# Patient Record
Sex: Female | Born: 1940
Health system: Southern US, Community
[De-identification: ages and names within clinical notes are randomized; demographics above are authoritative.]

## PROBLEM LIST (undated history)

## (undated) DIAGNOSIS — I10 Essential (primary) hypertension: Secondary | ICD-10-CM

## (undated) DIAGNOSIS — D649 Anemia, unspecified: Secondary | ICD-10-CM

## (undated) DIAGNOSIS — N750 Cyst of Bartholin's gland: Secondary | ICD-10-CM

## (undated) DIAGNOSIS — R112 Nausea with vomiting, unspecified: Secondary | ICD-10-CM

## (undated) DIAGNOSIS — E119 Type 2 diabetes mellitus without complications: Secondary | ICD-10-CM

## (undated) DIAGNOSIS — Z9889 Other specified postprocedural states: Secondary | ICD-10-CM

## (undated) DIAGNOSIS — R42 Dizziness and giddiness: Secondary | ICD-10-CM

## (undated) DIAGNOSIS — I8390 Asymptomatic varicose veins of unspecified lower extremity: Secondary | ICD-10-CM

## (undated) DIAGNOSIS — IMO0002 Reserved for concepts with insufficient information to code with codable children: Secondary | ICD-10-CM

## (undated) DIAGNOSIS — M1990 Unspecified osteoarthritis, unspecified site: Secondary | ICD-10-CM

## (undated) HISTORY — DX: Asymptomatic varicose veins of unspecified lower extremity: I83.90

## (undated) HISTORY — DX: Dizziness and giddiness: R42

## (undated) HISTORY — DX: Unspecified osteoarthritis, unspecified site: M19.90

## (undated) HISTORY — DX: Anemia, unspecified: D64.9

## (undated) HISTORY — PX: CHOLECYSTECTOMY: SHX55

## (undated) HISTORY — PX: APPENDECTOMY: SHX54

## (undated) HISTORY — PX: ABDOMINAL HYSTERECTOMY: SHX81

## (undated) HISTORY — DX: Reserved for concepts with insufficient information to code with codable children: IMO0002

## (undated) HISTORY — PX: KNEE ARTHROSCOPY: SUR90

---

## 1976-01-31 HISTORY — PX: VEIN LIGATION AND STRIPPING: SHX2653

## 1998-01-30 ENCOUNTER — Emergency Department (HOSPITAL_COMMUNITY): Admission: EM | Admit: 1998-01-30 | Discharge: 1998-01-30 | Payer: Self-pay | Admitting: Emergency Medicine

## 1998-02-02 ENCOUNTER — Emergency Department (HOSPITAL_COMMUNITY): Admission: EM | Admit: 1998-02-02 | Discharge: 1998-02-02 | Payer: Self-pay | Admitting: Emergency Medicine

## 1998-05-04 ENCOUNTER — Other Ambulatory Visit: Admission: RE | Admit: 1998-05-04 | Discharge: 1998-05-04 | Payer: Self-pay | Admitting: *Deleted

## 1999-03-05 ENCOUNTER — Emergency Department (HOSPITAL_COMMUNITY): Admission: EM | Admit: 1999-03-05 | Discharge: 1999-03-05 | Payer: Self-pay

## 1999-08-25 ENCOUNTER — Other Ambulatory Visit: Admission: RE | Admit: 1999-08-25 | Discharge: 1999-08-25 | Payer: Self-pay | Admitting: *Deleted

## 2000-07-13 ENCOUNTER — Encounter: Payer: Self-pay | Admitting: Surgery

## 2000-07-13 ENCOUNTER — Observation Stay (HOSPITAL_COMMUNITY): Admission: RE | Admit: 2000-07-13 | Discharge: 2000-07-14 | Payer: Self-pay | Admitting: Surgery

## 2000-07-13 ENCOUNTER — Encounter (INDEPENDENT_AMBULATORY_CARE_PROVIDER_SITE_OTHER): Payer: Self-pay | Admitting: Specialist

## 2002-08-26 ENCOUNTER — Other Ambulatory Visit: Admission: RE | Admit: 2002-08-26 | Discharge: 2002-08-26 | Payer: Self-pay | Admitting: Gynecology

## 2003-03-25 ENCOUNTER — Ambulatory Visit (HOSPITAL_COMMUNITY): Admission: RE | Admit: 2003-03-25 | Discharge: 2003-03-25 | Payer: Self-pay | Admitting: Orthopedic Surgery

## 2003-03-25 ENCOUNTER — Ambulatory Visit (HOSPITAL_BASED_OUTPATIENT_CLINIC_OR_DEPARTMENT_OTHER): Admission: RE | Admit: 2003-03-25 | Discharge: 2003-03-25 | Payer: Self-pay | Admitting: Orthopedic Surgery

## 2004-02-11 ENCOUNTER — Other Ambulatory Visit: Admission: RE | Admit: 2004-02-11 | Discharge: 2004-02-11 | Payer: Self-pay | Admitting: Gynecology

## 2005-03-01 ENCOUNTER — Other Ambulatory Visit: Admission: RE | Admit: 2005-03-01 | Discharge: 2005-03-01 | Payer: Self-pay | Admitting: Gynecology

## 2005-03-05 ENCOUNTER — Emergency Department (HOSPITAL_COMMUNITY): Admission: EM | Admit: 2005-03-05 | Discharge: 2005-03-05 | Payer: Self-pay | Admitting: Emergency Medicine

## 2006-03-06 ENCOUNTER — Ambulatory Visit: Payer: Self-pay | Admitting: Vascular Surgery

## 2006-03-19 ENCOUNTER — Ambulatory Visit: Payer: Self-pay | Admitting: Vascular Surgery

## 2006-05-01 ENCOUNTER — Ambulatory Visit: Payer: Self-pay | Admitting: Vascular Surgery

## 2006-05-14 ENCOUNTER — Ambulatory Visit: Payer: Self-pay | Admitting: Vascular Surgery

## 2006-06-14 ENCOUNTER — Other Ambulatory Visit: Admission: RE | Admit: 2006-06-14 | Discharge: 2006-06-14 | Payer: Self-pay | Admitting: Gynecology

## 2006-08-23 ENCOUNTER — Emergency Department (HOSPITAL_COMMUNITY): Admission: EM | Admit: 2006-08-23 | Discharge: 2006-08-23 | Payer: Self-pay | Admitting: Emergency Medicine

## 2008-03-15 ENCOUNTER — Emergency Department (HOSPITAL_COMMUNITY): Admission: EM | Admit: 2008-03-15 | Discharge: 2008-03-15 | Payer: Self-pay | Admitting: Emergency Medicine

## 2008-08-21 ENCOUNTER — Ambulatory Visit (HOSPITAL_BASED_OUTPATIENT_CLINIC_OR_DEPARTMENT_OTHER): Admission: RE | Admit: 2008-08-21 | Discharge: 2008-08-21 | Payer: Self-pay | Admitting: Orthopedic Surgery

## 2010-01-13 ENCOUNTER — Emergency Department (HOSPITAL_COMMUNITY)
Admission: EM | Admit: 2010-01-13 | Discharge: 2010-01-13 | Payer: Self-pay | Source: Home / Self Care | Admitting: Emergency Medicine

## 2010-04-11 LAB — POCT I-STAT, CHEM 8
BUN: 18 mg/dL (ref 6–23)
Calcium, Ion: 1.17 mmol/L (ref 1.12–1.32)
Chloride: 107 mEq/L (ref 96–112)
Creatinine, Ser: 0.8 mg/dL (ref 0.4–1.2)
Glucose, Bld: 87 mg/dL (ref 70–99)
HCT: 43 % (ref 36.0–46.0)
Hemoglobin: 14.6 g/dL (ref 12.0–15.0)
Potassium: 4.1 mEq/L (ref 3.5–5.1)
Sodium: 142 mEq/L (ref 135–145)
TCO2: 27 mmol/L (ref 0–100)

## 2010-05-08 LAB — BASIC METABOLIC PANEL
BUN: 15 mg/dL (ref 6–23)
Calcium: 9 mg/dL (ref 8.4–10.5)
Creatinine, Ser: 0.82 mg/dL (ref 0.4–1.2)
GFR calc non Af Amer: >60 mL/min (ref >60)
Glucose, Bld: 127 mg/dL — ABNORMAL HIGH (ref 70–99)

## 2010-05-08 LAB — POCT HEMOGLOBIN-HEMACUE: Hemoglobin: 12.9 g/dL (ref 12.0–15.0)

## 2010-06-07 ENCOUNTER — Other Ambulatory Visit (HOSPITAL_COMMUNITY): Payer: Self-pay | Admitting: Family Medicine

## 2010-06-07 DIAGNOSIS — Z1231 Encounter for screening mammogram for malignant neoplasm of breast: Secondary | ICD-10-CM

## 2010-06-14 NOTE — Op Note (Signed)
NAMEHARMANI, Erica Buckley            ACCOUNT NO.:  [id number]   MEDICAL RECORD NO.:  [id number]          PATIENT TYPE:  AMB   LOCATION:  DSC                          FACILITY:  [facility]   PHYSICIAN:  Harvie Junior, M.D.   DATE OF BIRTH:  04-11-1940   DATE OF PROCEDURE:  08/21/2008  DATE OF DISCHARGE:                               OPERATIVE REPORT   PREOPERATIVE DIAGNOSES:  1. Trigger finger, right long.  2. Ganglion cyst of the right long finger.   POSTOPERATIVE DIAGNOSES:  1. Trigger finger, right long.  2. Ganglion cyst of the right long finger.   PROCEDURE:  1. A1 pulley release, essentially trigger finger release, right long.  2. Excision of ganglion cyst, right long finger.   SURGEON:  Harvie Junior, MD   ASSISTANT:  Marshia Ly, PA   ANESTHESIA:  General.   BRIEF HISTORY:  Ms. Erica Buckley is a 70 year old female with history of  having had locking and catching of right long finger.  Exam showed that  she had a cyst as well as trigger finger.  We talked about treatment  options.  I felt that the most appropriate course of action was going to  be cyst excision and incision of trigger finger.  She was brought to the  operating room for these procedures.   PROCEDURES:  The patient was brought to the operating room.  After  adequate anesthesia was obtained with general anesthetic, the patient  was placed supine on the operating table.  The right hand was prepped  and draped in the usual sterile fashion.  Following this, a small  incision was made over the A1 pulley to the right long finger.  Subcutaneous tissue was taken down to the level of the trigger finger  which was clearly identified.  The A1 pulley was identified.  There was  a cystic mass coming right through the A1 pulley.  We basically incised  the A1 pulley and we were able to take the mass out as a single entity  and attention was turned back to the flexor tendon and pulled those into  wound to make sure there was  no locking or catching.  The A1 pulley was  completely released.  The wound was irrigated, suctioned dry.  The incision was closed with  interrupted nylon.  Sterile compressive dressing was applied.  The  patient was taken to the recovery room and was noted to be in  satisfactory condition.  Estimated blood loss for procedure was none.      Harvie Junior, M.D.  Electronically Signed     JLG/MEDQ  D:  08/21/2008  T:  08/22/2008  Job:  [id number]

## 2010-06-16 ENCOUNTER — Ambulatory Visit (HOSPITAL_COMMUNITY)
Admission: RE | Admit: 2010-06-16 | Discharge: 2010-06-16 | Disposition: A | Discharge status: Home / Self Care | Payer: Medicare PPO | Source: Ambulatory Visit | Attending: Family Medicine | Admitting: Family Medicine

## 2010-06-16 DIAGNOSIS — Z1231 Encounter for screening mammogram for malignant neoplasm of breast: Secondary | ICD-10-CM | POA: Insufficient documentation

## 2010-06-17 NOTE — Op Note (Signed)
NAMESABIRIN, BARAY                        ACCOUNT NO.:  [id number]   MEDICAL RECORD NO.:  [id number]                   PATIENT TYPE:  AMB   LOCATION:  DSC                                  FACILITY:  [facility]   PHYSICIAN:  Harvie Junior, M.D.                DATE OF BIRTH:  July 04, 1940   DATE OF PROCEDURE:  03/25/2003  DATE OF DISCHARGE:                                 OPERATIVE REPORT   PREOPERATIVE DIAGNOSIS:  Medial meniscal tear.   POSTOPERATIVE DIAGNOSIS:  Lateral meniscal tear, lateral condylar defect and  patellofemoral chondromalacia with medial meniscal tear and medial femoral  chondromalacia.  Multiple osteocartilaginous loose bodies.   OPERATION PERFORMED:  1. Partial medial and partial lateral meniscectomy.  2. Debridement of significant lateral femoral condylar defect.  3. Debridement of significant chondromalacia patella.  4. Removal of multiple osteocartilaginous loose bodies.   SURGEON:  Harvie Junior, M.D.   ASSISTANT:  Marshia Ly, P.A.   ANESTHESIA:  General.   INDICATIONS FOR PROCEDURE:  The patient is a 70 year old female with a long  history of having knee pain.  We had treated her conservatively with anti-  inflammatory medication, injection therapy.  None of this had worked and  because of continued complaints of pain, she was taken to the operating room  for operative knee arthroscopy.   DESCRIPTION OF PROCEDURE:  The patient was taken to the operating room and  after adequate anesthesia was obtained with general anesthetic, the patient  was placed supine on the operating table.  Attention was then turned to the  left knee which was prepped and draped in the usual sterile fashion.  Following this, routine arthroscopic examination of the knee revealed that  there was a small posterior horn tear of the medial meniscus, certainly  nothing dramatic, some grade 2 change on the medial femoral condyle.  These  things were debrided.  Patellofemoral joint  showed some grade 2 and 3 change  in the femoral trochlea.  This was debrided back to a smooth and stable rim.  Attention was turned laterally where the most significant pathology was.  She had a large lateral meniscal tear from the anterior horn around to the  midbody.  She also had a large lateral femoral condylar defect with a large  shouldering lesion.  This was debrided back to a smooth and stable rim but  still was a fairly large defect on the lateral side.  The lateral  meniscectomy was debrided back to a smooth and stable rim.  Multiple  osteocartilaginous loose bodies were debrided from within the knee joint.  After debridement of the lateral compartment, attention was turned back up  into the patellofemoral joint which was again evaluated and debrided from  both the medial and lateral portal.  A final check was made in the knee for  any loose of fragmented piece or articular cartilage.  Seeing  none, the knee  was copiously irrigated and suctioned dry.  The arthroscopic  portals were closed with a bandage.  A sterile compressive dressing was  applied.  The patient was taken to the recovery room where she was noted to  be in satisfactory condition.  The estimated blood loss for this procedure  was none.                                               Harvie Junior, M.D.    Ranae Plumber  D:  03/25/2003  T:  03/25/2003  Job:  [id number]

## 2010-06-17 NOTE — Op Note (Signed)
[place]. [facility]  Patient:    FLOREAN, HOOBLER                     MRN: [id number] Proc. Date: 07/13/00 Adm. Date:  [id number] Attending:  Andre Lefort CC:         Stacie Acres. Cliffton Asters, M.D.  Darcella Cheshire, M.D.   Operative Report  DATE OF BIRTH: 1940/07/02  CCS#: [id number]  PREOPERATIVE DIAGNOSIS: Chronic cholecystitis and cholelithiasis.  POSTOPERATIVE DIAGNOSIS: Significant chronic cholecystitis with adhesions of duodenum to gallbladder and cholelithiasis.  OPERATION/PROCEDURE: Laparoscopic cholecystectomy with intraoperative cholangiogram.  SURGEON: Sandria Bales. Ezzard Standing, M.D.  ANESTHESIA: General endotracheal.  ESTIMATED BLOOD LOSS: Minimal.  INDICATIONS FOR PROCEDURE: Ms. Milas Gain is a 70 year old white female, who comes in with symptomatic cholelithiasis, now for attempted laparoscopic cholecystectomy.  DESCRIPTION OF PROCEDURE: The patient was placed in the supine position and given general endotracheal anesthetic.  With her arms out from her side and PAS stockings in place, she was given 1 g of Ancef prior to the procedure. The abdomen was prepped with Betadine solution and sterilely draped.  An infraumbilical incision was made and sharp dissection carried down to the abdominal cavity.  A 0 degree laparoscope was inserted through a 12 mm Hasson trocar and the Hasson trocar secured with a Vicryl suture.  The 0 degree laparoscope was inserted into the abdomen cavity, visualizing the right and left lobes of the liver, unremarkable.  The stomach was unremarkable.  The remainder of the bowel which could be seen was unremarkable.  The rest was covered with omentum.  Two different trocars were placed, a 10 mm Ethicon trocar in xiphoid location and a 5 mm Ethicon trocar in the right subcostal location, with a 5 mm Ethicon trocar in the right lateral subcostal location.  The gallbladder was grasped and was noted to be enveloped in fat and  adhesions.  These had to be taken down sharply, primarily with hook Bovie coagulation and with just blunt dissection.  About mid way up the gallbladder there was noted to be duodenum stuck to the gallbladder.  Again, these were fairly chronic looking inflammatory changes.  I finally got down to the common bile duct and gallbladder junction and actually as I uncoiled the cystic duct it became fairly long and was probably about 3-4 cm in length.  I opened the triangle of Calot between the cystic duct, the common bile duct, and the liver.  I identified the cystic artery and these two branches were triply clipped and divided.  I then in part because of the difficulty with dissection with all the chronic inflammation and in part because of the length of her symptoms I went on and obtained intraoperative cholangiogram using a cutoff Taut catheter inserted throughout a 14 gauge Jelco into the abdomen and then into the cystic duct.  This was secured with an Endo Clip.  The intraoperative cholangiogram showed a really tiny common bile duct and the contrast refluxed up into the hepatic radicals and down into the common bile duct and duodenum, exited back up the pancreatic duct 3-4 cm. There was no obstruction of fat and no masses, and appeared to be a normal intraoperative cholangiogram.  The Taut catheter was then removed and the cystic duct triply clipped and divided.  A few branches of remaining attachments to the gallbladder were divided with clips and the gallbladder then sharply and bluntly dissected from the gallbladder bed.  The gallbladder was delivered  through the umbilicus in a catch Endobag. Actually I had to open the gallbladder and extract several stones before I could actually force it through the umbilical wound.  The umbilical wound was then closed with 3-0 Vicryl suture.  The abdomen was irrigated with about a liter of saline.  Each trocar site was visualized and there was no  bleeding.  The skin exit site was closed with 5-0 Vicryl, painted with tincture of benzoin, and sterilely dressed.  The patient tolerated the procedure well and was transported to the recovery room in good condition.  I plan to keep her overnight and then probably discharge in the morning. DD:  07/13/00 TD:  07/14/00 Job: [id number]

## 2011-01-17 ENCOUNTER — Ambulatory Visit (INDEPENDENT_AMBULATORY_CARE_PROVIDER_SITE_OTHER): Payer: Medicare PPO

## 2011-01-17 DIAGNOSIS — M79609 Pain in unspecified limb: Secondary | ICD-10-CM

## 2011-01-17 DIAGNOSIS — R609 Edema, unspecified: Secondary | ICD-10-CM

## 2011-01-17 DIAGNOSIS — R059 Cough, unspecified: Secondary | ICD-10-CM

## 2011-01-17 DIAGNOSIS — R05 Cough: Secondary | ICD-10-CM

## 2011-04-23 ENCOUNTER — Other Ambulatory Visit: Payer: Self-pay | Admitting: Family Medicine

## 2011-06-07 ENCOUNTER — Other Ambulatory Visit (HOSPITAL_COMMUNITY): Payer: Self-pay | Admitting: Family Medicine

## 2011-06-07 DIAGNOSIS — Z1231 Encounter for screening mammogram for malignant neoplasm of breast: Secondary | ICD-10-CM

## 2011-07-05 ENCOUNTER — Ambulatory Visit (HOSPITAL_COMMUNITY)
Admission: RE | Admit: 2011-07-05 | Discharge: 2011-07-05 | Disposition: A | Discharge status: Home / Self Care | Payer: Medicare PPO | Source: Ambulatory Visit | Attending: Family Medicine | Admitting: Family Medicine

## 2011-07-05 DIAGNOSIS — Z1231 Encounter for screening mammogram for malignant neoplasm of breast: Secondary | ICD-10-CM

## 2011-10-16 ENCOUNTER — Other Ambulatory Visit: Payer: Self-pay

## 2011-10-16 DIAGNOSIS — I83893 Varicose veins of bilateral lower extremities with other complications: Secondary | ICD-10-CM

## 2011-10-18 ENCOUNTER — Ambulatory Visit (INDEPENDENT_AMBULATORY_CARE_PROVIDER_SITE_OTHER): Payer: Medicare PPO | Admitting: Emergency Medicine

## 2011-10-18 VITALS — Ht 65.5 in | Wt 228.6 lb

## 2011-10-18 DIAGNOSIS — I872 Venous insufficiency (chronic) (peripheral): Secondary | ICD-10-CM

## 2011-10-18 DIAGNOSIS — M79609 Pain in unspecified limb: Secondary | ICD-10-CM

## 2011-10-18 DIAGNOSIS — I8310 Varicose veins of unspecified lower extremity with inflammation: Secondary | ICD-10-CM

## 2011-10-18 DIAGNOSIS — M79606 Pain in leg, unspecified: Secondary | ICD-10-CM

## 2011-10-18 NOTE — Progress Notes (Signed)
   Date:  10/18/2011   Name:  Erica Buckley   DOB:  November 01, 1940   MRN:  [id number] Gender: female Age: 71 y.o.  PCP:  No primary provider on file.    Chief Complaint: Leg Pain and Leg Swelling   History of Present Illness:  Erica Buckley is a 71 y.o. pleasant patient who presents with the following:  Long history of varicose veins.  Has venous stasis dermatitis that has dramatically worsened over the past few months. Seeing a podiatrist who took a biopsy.  She does not know the result.  Has pain in legs and intense itching on the left medial lower leg distally.  There is no problem list on file for this patient.   No past medical history on file.  No past surgical history on file.  History  Substance Use Topics  . Smoking status: Never Smoker   . Smokeless tobacco: Not on file  . Alcohol Use: Not on file    No family history on file.  Allergies  Allergen Reactions  . Codeine Shortness Of Breath    Medication list has been reviewed and updated.  Outpatient Prescriptions Prior to Visit  Medication Sig Dispense Refill  . valACYclovir (VALTREX) 500 MG tablet TAKE 1 TABLET BY MOUTH TWICE DAILY FOR 3 DAYS  30 tablet  0    Review of Systems:  As per HPI, otherwise negative.    Physical Examination: There were no vitals filed for this visit. Filed Vitals:   10/18/11 1908  Height: 5' 5.5" (1.664 m)  Weight: 228 lb 9.6 oz (103.692 kg)   Body mass index is 37.46 kg/(m^2). Ideal Body Weight: Weight in (lb) to have BMI = 25: 152.2    GEN: WDWN, NAD, Non-toxic, Alert & Oriented x 3 HEENT: Atraumatic, Normocephalic.  Ears and Nose: No external deformity. EXTR: No clubbing/cyanosis/edema NEURO: Normal gait.  PSYCH: Normally interactive. Conversant. Not depressed or anxious appearing.  Calm demeanor.  Left leg:  Moderate venous stasis dermatitisi  Assessment and Plan: Venous stasis dermatitis. Unna boot Follow up in one week  Carmelina Dane, MD I  have reviewed and agree with documentation. Robert P. Merla Riches, M.D.

## 2011-10-27 ENCOUNTER — Encounter: Payer: Self-pay | Admitting: Vascular Surgery

## 2011-10-30 ENCOUNTER — Ambulatory Visit (INDEPENDENT_AMBULATORY_CARE_PROVIDER_SITE_OTHER): Payer: Medicare PPO | Admitting: Vascular Surgery

## 2011-10-30 ENCOUNTER — Encounter (INDEPENDENT_AMBULATORY_CARE_PROVIDER_SITE_OTHER): Payer: Medicare PPO | Admitting: *Deleted

## 2011-10-30 ENCOUNTER — Other Ambulatory Visit: Payer: Self-pay | Admitting: *Deleted

## 2011-10-30 ENCOUNTER — Encounter: Payer: Self-pay | Admitting: Vascular Surgery

## 2011-10-30 VITALS — BP 176/93 | HR 66 | Resp 16 | Ht 66.5 in | Wt 220.0 lb

## 2011-10-30 DIAGNOSIS — I83893 Varicose veins of bilateral lower extremities with other complications: Secondary | ICD-10-CM | POA: Insufficient documentation

## 2011-10-30 DIAGNOSIS — M7989 Other specified soft tissue disorders: Secondary | ICD-10-CM

## 2011-10-30 NOTE — Progress Notes (Signed)
Subjective:     Patient ID: Erica Buckley, female   DOB: 04/13/1940, 71 y.o.   MRN: [id number]  HPI this 71 year old female was referred for severe venous insufficiency of both lower extremities. She had a vein stripping in the right leg performed in 1978. She has had some sclerotherapy intermittently on the left leg but no vein stripping. She has developed darkness in the lower third of both legs in the skin and has had some bleeding episodes from thickening medially on the left ankle area. She has no history of stasis ulcers. Both legs cause severe pain with aching throbbing and burning discomfort throughout the day. She does not wear elastic compression stockings. She has no history of DVT or thrombophlebitis. The symptoms are becoming quite severe she states affect her daily living.  Past Medical History  Diagnosis Date  . Anemia   . Ulcer   . Varicose veins     History  Substance Use Topics  . Smoking status: Never Smoker   . Smokeless tobacco: Not on file  . Alcohol Use: 0.6 oz/week    1 Glasses of wine per week    Family History  Problem Relation Age of Onset  . Cancer Father   . Diabetes Father     Allergies  Allergen Reactions  . Codeine Shortness Of Breath    Current outpatient prescriptions:ibuprofen (ADVIL,MOTRIN) 800 MG tablet, Take 800 mg by mouth every 8 (eight) hours as needed., Disp: , Rfl: ;  losartan-hydrochlorothiazide (HYZAAR) 50-12.5 MG per tablet, Take 1 tablet by mouth daily., Disp: , Rfl: ;  metoprolol succinate (TOPROL-XL) 50 MG 24 hr tablet, Take 50 mg by mouth daily. Take with or immediately following a meal., Disp: , Rfl:  valACYclovir (VALTREX) 500 MG tablet, TAKE 1 TABLET BY MOUTH TWICE DAILY FOR 3 DAYS, Disp: 30 tablet, Rfl: 0  BP 176/93  Pulse 66  Resp 16  Ht 5' 6.5" (1.689 m)  Wt 220 lb (99.791 kg)  BMI 34.98 kg/m2  Body mass index is 34.98 kg/(m^2).          Review of Systems denies chest pain, dyspnea on exertion, PND,  orthopnea, hemoptysis, lateralizing weakness, claudication. All systems negative for complete review of systems     Objective:   Physical Exam blood pressure 176-93 heart rate 66 respirations 16 Gen.-alert and oriented x3 in no apparent distress HEENT normal for age Lungs no rhonchi or wheezing Cardiovascular regular rhythm no murmurs carotid pulses 3+ palpable no bruits audible Abdomen soft nontender no palpable masses Musculoskeletal free of  major deformities Skin clear -no rashes Neurologic normal Lower extremities 3+ femoral and dorsalis pedis pulses palpable bilaterally with 1+ edema bilaterally. Right leg with hyperpigmentation lower third medially with no active ulcers. A few bulging varicosities in the medial calf noted on the right side. Left leg has severe thickening hyperpigmentation lower third of the leg with some evidence of previous superficial ulceration near the medial malleolus. Foot well-perfused. Bulging varicosities in the medial distal thigh and medial calf over great saphenous system.  Today I ordered a venous duplex exam of both legs which are reviewed and interpreted. Left leg has gross reflux in the left great saphenous system and the small saphenous system has some mild reflux but not a large caliber vein. There is some deep vein reflux is well. Right leg has gross reflux in a large small saphenous vein. Right saphenous vein has been stripped but a few areas show some mild reflux small-caliber.  Assessment:     Severe bilateral venous insufficiency with severe skin changes-no active ulcer-history of bleeding left ankle-affecting patient's daily living because of pain    Plan:     #1 long-leg elastic compression stockings 20-30 mm gradient #2 elevate legs much possibly day #3 ibuprofen on a daily basis #4 return in 3 months to see Dr. Karlene Buckley who she requested. If no significant improvement I think she should have laser ablation left great saphenous vein to  be followed by laser ablation right small saphenous vein. She would be return in 3 months and possibly have stab phlebectomy of secondary varicosities.

## 2011-11-24 ENCOUNTER — Encounter: Payer: Medicare PPO | Admitting: Vascular Surgery

## 2011-12-12 ENCOUNTER — Other Ambulatory Visit: Payer: Self-pay | Admitting: Physician Assistant

## 2012-01-29 ENCOUNTER — Encounter: Payer: Self-pay | Admitting: Vascular Surgery

## 2012-01-30 ENCOUNTER — Ambulatory Visit: Payer: Medicare PPO | Admitting: Vascular Surgery

## 2012-02-12 ENCOUNTER — Encounter: Payer: Self-pay | Admitting: Vascular Surgery

## 2012-02-13 ENCOUNTER — Encounter: Payer: Self-pay | Admitting: Vascular Surgery

## 2012-02-13 ENCOUNTER — Ambulatory Visit (INDEPENDENT_AMBULATORY_CARE_PROVIDER_SITE_OTHER): Payer: Medicare PPO | Admitting: Vascular Surgery

## 2012-02-13 VITALS — BP 121/43 | HR 81 | Ht 66.5 in | Wt 223.0 lb

## 2012-02-13 DIAGNOSIS — I83893 Varicose veins of bilateral lower extremities with other complications: Secondary | ICD-10-CM

## 2012-02-13 NOTE — Progress Notes (Signed)
Problems with Activities of Daily Living Secondary to Leg Pain  1. Mrs. Erica Buckley states any activities that require prolonged standing (cooking, cleaning,shopping) is very difficult due to leg pain.  2. Mrs. Erica Buckley states she has difficulty sleeping at night because leg pain wakes her up during the night.    3. Mrs. Erica Buckley states that she has stopped walking for exercise due to leg.   Failure of  Conservative Therapy:  1. Worn 20-30 mm Hg thigh high compression hose >3 months with no relief of symptoms.  2. Frequently elevates legs-no relief of symptoms  3. Taken Ibuprofen 600 Mg TID with no relief of symptoms.  The patient presents today for continued followup of her severe bilateral venous hypertension. She has marked progression of venous stasis changes with thickened skin and hemosiderin deposits bilaterally. She continues to have ankle pain and thigh pain despite the use of graduated compression garments. She does elevate her legs when possible as well. She does have a complex past history with prior saphenous vein stripping in her thigh 30 years ago.  I have reviewed her formal venous duplex in her reimage both legs myself with SonoSite. She does have reflux in her great saphenous vein on the left in her small saphenous vein on the right. Marked tributary varicosities arising from both of these. I believe that she has failed conservative treatment.  I have recommended laser ablation of her left great saphenous vein and right small saphenous vein. I explained that she may continue to have difficulty and need a staged as a stab phlebectomy if she continues to have persistent pain. She wished to proceed as soon as possible. She understands this is an outpatient procedure under local anesthetic and the slight risk of deep venous injury.

## 2012-02-14 ENCOUNTER — Other Ambulatory Visit: Payer: Self-pay | Admitting: *Deleted

## 2012-02-14 DIAGNOSIS — I83893 Varicose veins of bilateral lower extremities with other complications: Secondary | ICD-10-CM

## 2012-02-28 ENCOUNTER — Other Ambulatory Visit: Payer: Self-pay | Admitting: *Deleted

## 2012-02-28 ENCOUNTER — Encounter: Payer: Self-pay | Admitting: Vascular Surgery

## 2012-02-28 DIAGNOSIS — I83893 Varicose veins of bilateral lower extremities with other complications: Secondary | ICD-10-CM

## 2012-02-29 ENCOUNTER — Ambulatory Visit (INDEPENDENT_AMBULATORY_CARE_PROVIDER_SITE_OTHER): Payer: Medicare PPO | Admitting: Vascular Surgery

## 2012-02-29 ENCOUNTER — Encounter: Payer: Self-pay | Admitting: Vascular Surgery

## 2012-02-29 VITALS — BP 131/79 | HR 76 | Resp 18 | Ht 67.0 in | Wt 223.0 lb

## 2012-02-29 DIAGNOSIS — L97929 Non-pressure chronic ulcer of unspecified part of left lower leg with unspecified severity: Secondary | ICD-10-CM

## 2012-02-29 DIAGNOSIS — I872 Venous insufficiency (chronic) (peripheral): Secondary | ICD-10-CM

## 2012-02-29 DIAGNOSIS — I83219 Varicose veins of right lower extremity with both ulcer of unspecified site and inflammation: Secondary | ICD-10-CM

## 2012-02-29 DIAGNOSIS — L97919 Non-pressure chronic ulcer of unspecified part of right lower leg with unspecified severity: Secondary | ICD-10-CM

## 2012-02-29 HISTORY — PX: ENDOVENOUS ABLATION SAPHENOUS VEIN W/ LASER: SUR449

## 2012-02-29 NOTE — Progress Notes (Signed)
Laser Ablation Procedure      Date: 02/29/2012    Erica Buckley DOB:1940/10/04  Consent signed: Yes  Surgeon:T.F. Early  Procedure: Laser Ablation: right Small Saphenous Vein  BP 131/79  Pulse 76  Resp 18  Ht 5\' 7"  (1.702 m)  Wt 223 lb (101.152 kg)  BMI 34.93 kg/m2  Start time: 8 :30 AM   End time: 9:20AM  Tumescent Anesthesia: 100 cc 0.9% NaCl with 50 cc Lidocaine HCL with 1% Epi and 15 cc 8.4% NaHCO3  Local Anesthesia: 2 cc Lidocaine HCL and NaHCO3 (ratio 2:1)  Continuous Mode: 15 Watts Total Energy 565 Joules Total Time0:37    Patient tolerated procedure well: Yes    Description of Procedure:  After marking the course of the saphenous vein and the secondary varicosities in the standing position, the patient was placed on the operating table in the prone position, and the right leg was prepped and draped in sterile fashion. Local anesthetic was administered, and under ultrasound guidance the saphenous vein was accessed with a micro needle and guide wire; then the micro puncture sheath was placed. A guide wire was inserted to the saphenopopliteal junction, followed by a 5 french sheath.  The position of the sheath and then the laser fiber below the junction was confirmed using the ultrasound and visualization of the aiming beam.  Tumescent anesthesia was administered along the course of the saphenous vein using ultrasound guidance. Protective laser glasses were placed on the patient, and the laser was fired at at 15 watt continuous mode.  For a total of 565 joules.  A steri strip was applied to the puncture site.     ABD pads and thigh high compression stockings were applied.  Ace wrap bandages were applied  at the top of the saphenopopliteal junction.  Blood loss was less than 15 cc.  The patient ambulated out of the operating room having tolerated the procedure well.  The patient has venous hypertension and marked changes of venous stasis disease in both medial ankles. Our  plan had been left great saphenous vein ablation of a. Reports more inflammation more discomfort on her right calf and therefore we opted for proceeding with right small saphenous ablation today and staged left great saphenous vein. The patient tolerated procedure well without immediate complication will be seen again in one week for repeat ultrasound

## 2012-03-01 ENCOUNTER — Encounter: Payer: Self-pay | Admitting: Vascular Surgery

## 2012-03-05 ENCOUNTER — Telehealth: Payer: Self-pay | Admitting: *Deleted

## 2012-03-05 NOTE — Telephone Encounter (Signed)
03/05/2012  Time: 8:46 AM   Patient Name: Erica Buckley  Patient of: T.F. Early  Procedure:Laser Ablation right small saphenous vein 02-29-2012  Reached patient at home and checked  Her status  Yes    Comments/Actions Taken: Mrs. Milas Gain states she is experiencing mild/moderate pain in her posterior right calf (site of laser ablation) which is lessened with Ibuprofen.  Encouraged her to use ice compress on her right posterior calf as needed for pain in addition to the Ibuprofen.  Encouraged her to keep her right leg elevated.  Reviewed post procedural instructions with her and reminded her of post laser ablation duplex and follow up appointment with Dr. Arbie Cookey on 03-07-2012.      @SIGNATURE @

## 2012-03-06 ENCOUNTER — Encounter: Payer: Self-pay | Admitting: Vascular Surgery

## 2012-03-07 ENCOUNTER — Ambulatory Visit (INDEPENDENT_AMBULATORY_CARE_PROVIDER_SITE_OTHER): Payer: Medicare PPO | Admitting: Vascular Surgery

## 2012-03-07 ENCOUNTER — Encounter: Payer: Self-pay | Admitting: Vascular Surgery

## 2012-03-07 ENCOUNTER — Encounter (INDEPENDENT_AMBULATORY_CARE_PROVIDER_SITE_OTHER): Payer: Medicare PPO | Admitting: *Deleted

## 2012-03-07 VITALS — BP 145/84 | HR 60 | Resp 18 | Ht 67.0 in | Wt 220.0 lb

## 2012-03-07 DIAGNOSIS — I83893 Varicose veins of bilateral lower extremities with other complications: Secondary | ICD-10-CM

## 2012-03-07 DIAGNOSIS — Z48812 Encounter for surgical aftercare following surgery on the circulatory system: Secondary | ICD-10-CM | POA: Insufficient documentation

## 2012-03-07 NOTE — Progress Notes (Deleted)
Patient ID: Erica Buckley, female   DOB: [birth year], 72 y.o.   MRN: [id number]

## 2012-03-07 NOTE — Progress Notes (Signed)
The patient presents today for followup of her right small saphenous vein ablation one week ago. She has the usual amount of soreness in her posterior calf but otherwise no new difficulties. She continues to have discomfort in the area of hemosiderin deposits and venous stasis changes in her ankle bilaterally. He has been compliant with her compression garments.  She underwent a formal duplex which is no evidence of DVT. This does show some persistent flow in her small saphenous vein. I imaged this myself with sinusitis does show partial closure. She had a relatively short segment from the origin of the popliteal vein to the area of the branching and varicosities in her calf. There is some flow in a discussed this with the patient. She does have significant curvature varicosities at may require phlebectomy as well. We will continue observation of this. She does have significant reflux in her left great saphenous vein and is scheduled for a left great saphenous vein ablation on March 13. We will re\re evaluate her right leg this time as well

## 2012-04-10 ENCOUNTER — Encounter: Payer: Self-pay | Admitting: Vascular Surgery

## 2012-04-11 ENCOUNTER — Ambulatory Visit (INDEPENDENT_AMBULATORY_CARE_PROVIDER_SITE_OTHER): Payer: Medicare PPO | Admitting: Vascular Surgery

## 2012-04-11 ENCOUNTER — Encounter: Payer: Self-pay | Admitting: Vascular Surgery

## 2012-04-11 VITALS — BP 138/67 | HR 65 | Resp 18 | Ht 66.5 in | Wt 215.0 lb

## 2012-04-11 DIAGNOSIS — I83893 Varicose veins of bilateral lower extremities with other complications: Secondary | ICD-10-CM

## 2012-04-11 HISTORY — PX: ENDOVENOUS ABLATION SAPHENOUS VEIN W/ LASER: SUR449

## 2012-04-11 NOTE — Progress Notes (Signed)
Laser Ablation Procedure      Date: 04/11/2012    Erica Buckley DOB:1940/12/08  Consent signed: Yes  Surgeon:T.F. Early  Procedure: Laser Ablation: left Greater Saphenous Vein  BP 138/67  Pulse 65  Resp 18  Ht 5' 6.5" (1.689 m)  Wt 215 lb (97.523 kg)  BMI 34.19 kg/m2  Start time: 10:30AM   End time: 11:55AM  Tumescent Anesthesia: 400 cc 0.9% NaCl with 50 cc Lidocaine HCL with 1% Epi and 15 cc 8.4% NaHCO3  Local Anesthesia: 2 cc Lidocaine HCL and NaHCO3 (ratio 2:1)  Continuous Mode: 15 Watts Total Energy #1  880 Joules      #2    1513 Joules Total Time#1  58 seconds   #2 1:40  minutes        Patient tolerated procedure well: Yes    Description of Procedure:  After marking the course of the saphenous vein and the secondary varicosities in the standing position, the patient was placed on the operating table in the supine position, and the left leg was prepped and draped in sterile fashion. Local anesthetic was administered, and under ultrasound guidance the saphenous vein was accessed with a micro needle and guide wire; then the micro puncture sheath was placed. A guide wire was inserted to the saphenofemoral junction, followed by a 5 french sheath.  The position of the sheath and then the laser fiber below the junction was confirmed using the ultrasound and visualization of the aiming beam.  Tumescent anesthesia was administered along the course of the saphenous vein using ultrasound guidance. Protective laser glasses were placed on the patient, and the laser was fired at at 15 watt continuous mode.  For a total of #1 880 Joules  #2  1513 Joules.  A steri strip was applied to the puncture site.  The patient had 2 treatment length of her left saphenous vein. 1 from mid calf to just above the knee and the second from the proximal thigh up to the just below the saphenofemoral junction. She had no immediate complications    ABD pads and thigh high compression stockings were  applied.  Ace wrap bandages were applied at the top of the saphenofemoral junction.  Blood loss was less than 15 cc.  The patient ambulated out of the operating room having tolerated the procedure well.

## 2012-04-12 ENCOUNTER — Telehealth: Payer: Self-pay | Admitting: *Deleted

## 2012-04-12 NOTE — Telephone Encounter (Signed)
04/12/2012  Time: 12:25 PM   Patient Name: Erica Buckley  Patient of: T.F. Early  Procedure:Laser Ablation left greater saphenous vein 04-11-2012  Reached patient at home and checked  Her status  Yes    Comments/Actions Taken: Mrs. Milas Gain states she had "a rough night" last night (04-11-2012).  She states she is still experiencing moderate/severe pain in her left inner thigh---worse in the left inguinal area. Encouraged her to use Arnicare cream to the affected area, use ice compress as needed for pain to affected area, take Ibuprofen 600 mg po three times daily with meals, keep left leg elevated when sitting, and to wear the compression dressing until noon 04-13-2012 and then wear compression hose only daytime for 2 weeks.  Encouraged her to make sure compression dressing (including ace wrap) is pulled up high, completing covering upper left inner thigh where she is experiencing the discomfort.  Reminded her of post laser ablation duplex and follow up appointment with Dr. Arbie Cookey on 04-18-2012.        @SIGNATURE @

## 2012-04-17 ENCOUNTER — Encounter: Payer: Self-pay | Admitting: Vascular Surgery

## 2012-04-18 ENCOUNTER — Telehealth: Payer: Self-pay | Admitting: *Deleted

## 2012-04-18 ENCOUNTER — Ambulatory Visit: Payer: Medicare PPO | Admitting: Vascular Surgery

## 2012-04-18 NOTE — Telephone Encounter (Signed)
Erica Buckley states she is too sore and tender to undergo post ablation Duplex this morning so she is cancelling the appointment.  She denies swelling.  Encouraged her to take Ibuprofen as directed, elevate her left leg, wear compression hose daytime.  Informed Dr. Arbie Cookey of Erica Buckley cancellation of duplex and follow up appointment with him today and he agrees with rescheduling appointments to Tuesday, March 25 (8:30A ultrasound, 9:15A Dr. Arbie Cookey). Erica Buckley notified of rescheduled appointments on March 25 and she states she will be present for those appointments.

## 2012-04-22 ENCOUNTER — Encounter: Payer: Self-pay | Admitting: Vascular Surgery

## 2012-04-23 ENCOUNTER — Encounter (INDEPENDENT_AMBULATORY_CARE_PROVIDER_SITE_OTHER): Payer: Medicare PPO | Admitting: *Deleted

## 2012-04-23 ENCOUNTER — Ambulatory Visit (INDEPENDENT_AMBULATORY_CARE_PROVIDER_SITE_OTHER): Payer: Medicare PPO | Admitting: Vascular Surgery

## 2012-04-23 ENCOUNTER — Encounter: Payer: Self-pay | Admitting: Vascular Surgery

## 2012-04-23 VITALS — BP 130/55 | HR 63 | Resp 18 | Ht 67.0 in | Wt 220.9 lb

## 2012-04-23 DIAGNOSIS — I83229 Varicose veins of left lower extremity with both ulcer of unspecified site and inflammation: Secondary | ICD-10-CM

## 2012-04-23 DIAGNOSIS — Z48812 Encounter for surgical aftercare following surgery on the circulatory system: Secondary | ICD-10-CM

## 2012-04-23 DIAGNOSIS — I83893 Varicose veins of bilateral lower extremities with other complications: Secondary | ICD-10-CM

## 2012-04-23 DIAGNOSIS — I83221 Varicose veins of left lower extremity with both ulcer of thigh and inflammation: Secondary | ICD-10-CM

## 2012-04-23 DIAGNOSIS — I83219 Varicose veins of right lower extremity with both ulcer of unspecified site and inflammation: Secondary | ICD-10-CM

## 2012-04-23 DIAGNOSIS — I83224 Varicose veins of left lower extremity with both ulcer of heel and midfoot and inflammation: Secondary | ICD-10-CM

## 2012-04-23 DIAGNOSIS — L97919 Non-pressure chronic ulcer of unspecified part of right lower leg with unspecified severity: Secondary | ICD-10-CM

## 2012-04-23 NOTE — Progress Notes (Signed)
The patient presents today for followup of her left great saphenous laser ablation for venous hypertension. She is approximately 10 days out. She had more than the usual amount of discomfort throughout her medial left thigh. She reports that this is resolving.  On physical exam she has mild bruising in her upper thigh. She has no bruising in her mid to distal thigh. She does have a palpable thrombosed saphenous vein just below the level of the knee on the medial aspect. She has no skin irritation. She continues to have severe chronic skin changes from her severe long-term venous hypertension.  Noninvasive vascular lab studies today on her left leg revealed complete closure of her great saphenous vein from the proximal calf up to just below the saphenofemoral junction. There is no evidence of DVT.  Impression and plan successful ablation of her left great saphenous vein with no complications. She is resolving the discomfort she had around the time of the procedure. She will continue use of her bilateral compression garments the 2 her bilateral deep venous reflux as well. She will notify us if she has difficulty in the future

## 2012-07-25 ENCOUNTER — Other Ambulatory Visit: Payer: Self-pay | Admitting: *Deleted

## 2012-07-25 DIAGNOSIS — M79609 Pain in unspecified limb: Secondary | ICD-10-CM

## 2012-07-31 ENCOUNTER — Encounter: Payer: Self-pay | Admitting: Vascular Surgery

## 2012-08-01 ENCOUNTER — Encounter (INDEPENDENT_AMBULATORY_CARE_PROVIDER_SITE_OTHER): Payer: Medicare PPO | Admitting: Vascular Surgery

## 2012-08-01 ENCOUNTER — Encounter: Payer: Self-pay | Admitting: Vascular Surgery

## 2012-08-01 ENCOUNTER — Ambulatory Visit (INDEPENDENT_AMBULATORY_CARE_PROVIDER_SITE_OTHER): Payer: Medicare PPO | Admitting: Vascular Surgery

## 2012-08-01 VITALS — BP 184/78 | HR 58 | Resp 16 | Ht 66.0 in | Wt 219.0 lb

## 2012-08-01 DIAGNOSIS — M79609 Pain in unspecified limb: Secondary | ICD-10-CM | POA: Insufficient documentation

## 2012-08-01 DIAGNOSIS — I83893 Varicose veins of bilateral lower extremities with other complications: Secondary | ICD-10-CM

## 2012-08-01 NOTE — Progress Notes (Signed)
The patient presents today for followup of venous hypertension. She is known to me from prior staged laser ablation. This was on her left great saphenous vein and right small saphenous vein. She does have known deep venous reflux and significant changes of venous hypertension bilaterally. She was concerned regarding increasing and some sharp discomfort in her right ankle. She does not have any history of DVT. She does have significant changes of venous hypertension with hemosiderin deposit but has not had venous ulceration  Past Medical History  Diagnosis Date  . Anemia   . Ulcer   . Varicose veins     History  Substance Use Topics  . Smoking status: Never Smoker   . Smokeless tobacco: Never Used  . Alcohol Use: 0.6 oz/week    1 Glasses of wine per week    Family History  Problem Relation Age of Onset  . Cancer Father   . Diabetes Father     Allergies  Allergen Reactions  . Codeine Shortness Of Breath    Current outpatient prescriptions:ibuprofen (ADVIL,MOTRIN) 800 MG tablet, Take 800 mg by mouth every 8 (eight) hours as needed., Disp: , Rfl: ;  losartan-hydrochlorothiazide (HYZAAR) 50-12.5 MG per tablet, Take 1 tablet by mouth daily., Disp: , Rfl: ;  metoprolol succinate (TOPROL-XL) 50 MG 24 hr tablet, Take 50 mg by mouth daily. Take with or immediately following a meal., Disp: , Rfl: ;  valACYclovir (VALTREX) 500 MG tablet, , Disp: , Rfl:   BP 184/78  Pulse 58  Resp 16  Ht 5\' 6"  (1.676 m)  Wt 219 lb (99.338 kg)  BMI 35.36 kg/m2  SpO2 100%  Body mass index is 35.36 kg/(m^2).       Physical exam: Developed female appearing stated age in no distress Respirations nonlabored Neurologically she is grossly intact He does have a circumferential hemosiderin deposits and thickening of skin from the level of her malleolus proximally to distal calf. He does have small scattered superficial varicosities in both lower Shoney's.  Venous duplex today reveals some patency of the  for several centimeters of her right small saphenous vein closure below this. Her right great saphenous vein is competent to the level of the knee. The vein is incompetent below this but relatively small by SonoSite ultrasound.  Impression and plan: A ongoing issues regarding venous hypertension bilaterally. I do not feel that she any significant benefit from ablation of a small area still open below the popliteal vein on the right. She does not have significant reflux in the great saphenous vein on the right and the vein is slightly enlarged below the knee. Feel that her issues mainly related to her venous hypertension bladder superficial femoral vein and popliteal vein. She is concern regarding of DVT. A portion she has never had any issue regarding this and I do not that she is at significantly increased risk. She was reassured that this. She will continue to elevate her legs when possible. She sits with her legs elevated again explained that this does little for improvement that she really needs to lie with her feet aren't in her heart. Also she has not been compliant with her compression garments and again stressing critical importance of this. She will continue the treatments and see [place] again on an as-needed basis

## 2012-10-03 ENCOUNTER — Encounter (INDEPENDENT_AMBULATORY_CARE_PROVIDER_SITE_OTHER): Payer: Self-pay

## 2012-10-03 DIAGNOSIS — I83893 Varicose veins of bilateral lower extremities with other complications: Secondary | ICD-10-CM

## 2012-11-14 ENCOUNTER — Other Ambulatory Visit (HOSPITAL_COMMUNITY): Payer: Self-pay | Admitting: Family Medicine

## 2012-11-14 DIAGNOSIS — Z1231 Encounter for screening mammogram for malignant neoplasm of breast: Secondary | ICD-10-CM

## 2012-11-15 ENCOUNTER — Ambulatory Visit (INDEPENDENT_AMBULATORY_CARE_PROVIDER_SITE_OTHER): Payer: Medicare PPO | Admitting: Family Medicine

## 2012-11-15 VITALS — BP 108/58 | HR 58 | Temp 98.0°F | Resp 16 | Ht 66.0 in | Wt 231.0 lb

## 2012-11-15 DIAGNOSIS — R05 Cough: Secondary | ICD-10-CM

## 2012-11-15 DIAGNOSIS — R059 Cough, unspecified: Secondary | ICD-10-CM

## 2012-11-15 DIAGNOSIS — J069 Acute upper respiratory infection, unspecified: Secondary | ICD-10-CM

## 2012-11-15 MED ORDER — AZITHROMYCIN 250 MG PO TABS: ORAL_TABLET | ORAL | Status: DC

## 2012-11-15 NOTE — Patient Instructions (Addendum)
Saline nasal spray atleast 4 times per day, over the counter mucinex or mucinex DM if needed for cough, drink plenty of fluids.  Keep a record of your blood pressures outside of the office and if remains under 110 on upper number or lightheaded or dizzy - may need to stop Toprol temporarily.  Call your primary provider or return here or ER if this occurs.  If not improving in next 4 to 5 days - can fill antibiotic, but return to the clinic or go to the nearest emergency room if any of your symptoms worsen or new symptoms occur.  Upper Respiratory Infection, Adult An upper respiratory infection (URI) is also sometimes known as the common cold. The upper respiratory tract includes the nose, sinuses, throat, trachea, and bronchi. Bronchi are the airways leading to the lungs. Most people improve within 1 week, but symptoms can last up to 2 weeks. A residual cough may last even longer.  CAUSES Many different viruses can infect the tissues lining the upper respiratory tract. The tissues become irritated and inflamed and often become very moist. Mucus production is also common. A cold is contagious. You can easily spread the virus to others by oral contact. This includes kissing, sharing a glass, coughing, or sneezing. Touching your mouth or nose and then touching a surface, which is then touched by another person, can also spread the virus. SYMPTOMS  Symptoms typically develop 1 to 3 days after you come in contact with a cold virus. Symptoms vary from person to person. They may include:  Runny nose.  Sneezing.  Nasal congestion.  Sinus irritation.  Sore throat.  Loss of voice (laryngitis).  Cough.  Fatigue.  Muscle aches.  Loss of appetite.  Headache.  Low-grade fever. DIAGNOSIS  You might diagnose your own cold based on familiar symptoms, since most people get a cold 2 to 3 times a year. Your caregiver can confirm this based on your exam. Most importantly, your caregiver can check that  your symptoms are not due to another disease such as strep throat, sinusitis, pneumonia, asthma, or epiglottitis. Blood tests, throat tests, and X-rays are not necessary to diagnose a common cold, but they may sometimes be helpful in excluding other more serious diseases. Your caregiver will decide if any further tests are required. RISKS AND COMPLICATIONS  You may be at risk for a more severe case of the common cold if you smoke cigarettes, have chronic heart disease (such as heart failure) or lung disease (such as asthma), or if you have a weakened immune system. The very young and very old are also at risk for more serious infections. Bacterial sinusitis, middle ear infections, and bacterial pneumonia can complicate the common cold. The common cold can worsen asthma and chronic obstructive pulmonary disease (COPD). Sometimes, these complications can require emergency medical care and may be life-threatening. PREVENTION  The best way to protect against getting a cold is to practice good hygiene. Avoid oral or hand contact with people with cold symptoms. Wash your hands often if contact occurs. There is no clear evidence that vitamin C, vitamin E, echinacea, or exercise reduces the chance of developing a cold. However, it is always recommended to get plenty of rest and practice good nutrition. TREATMENT  Treatment is directed at relieving symptoms. There is no cure. Antibiotics are not effective, because the infection is caused by a virus, not by bacteria. Treatment may include:  Increased fluid intake. Sports drinks offer valuable electrolytes, sugars, and fluids.  Breathing  heated mist or steam (vaporizer or shower).  Eating chicken soup or other clear broths, and maintaining good nutrition.  Getting plenty of rest.  Using gargles or lozenges for comfort.  Controlling fevers with ibuprofen or acetaminophen as directed by your caregiver.  Increasing usage of your inhaler if you have  asthma. Zinc gel and zinc lozenges, taken in the first 24 hours of the common cold, can shorten the duration and lessen the severity of symptoms. Pain medicines may help with fever, muscle aches, and throat pain. A variety of non-prescription medicines are available to treat congestion and runny nose. Your caregiver can make recommendations and may suggest nasal or lung inhalers for other symptoms.  HOME CARE INSTRUCTIONS   Only take over-the-counter or prescription medicines for pain, discomfort, or fever as directed by your caregiver.  Use a warm mist humidifier or inhale steam from a shower to increase air moisture. This may keep secretions moist and make it easier to breathe.  Drink enough water and fluids to keep your urine clear or pale yellow.  Rest as needed.  Return to work when your temperature has returned to normal or as your caregiver advises. You may need to stay home longer to avoid infecting others. You can also use a face mask and careful hand washing to prevent spread of the virus. SEEK MEDICAL CARE IF:   After the first few days, you feel you are getting worse rather than better.  You need your caregiver's advice about medicines to control symptoms.  You develop chills, worsening shortness of breath, or brown or red sputum. These may be signs of pneumonia.  You develop yellow or brown nasal discharge or pain in the face, especially when you bend forward. These may be signs of sinusitis.  You develop a fever, swollen neck glands, pain with swallowing, or white areas in the back of your throat. These may be signs of strep throat. SEEK IMMEDIATE MEDICAL CARE IF:   You have a fever.  You develop severe or persistent headache, ear pain, sinus pain, or chest pain.  You develop wheezing, a prolonged cough, cough up blood, or have a change in your usual mucus (if you have chronic lung disease).  You develop sore muscles or a stiff neck. Document Released: 07/12/2000  Document Revised: 04/10/2011 Document Reviewed: 05/20/2010 [facility] Patient Information 2014 [place], [place].

## 2012-11-15 NOTE — Progress Notes (Signed)
Subjective:    Patient ID: Erica Buckley, female    DOB: [birth year], 72 y.o.   MRN: [id number] This chart was scribed for Meredith Staggers, MD by Valera Castle, ED Scribe. This patient was seen in room 5 and the patient's care was started at 1:26 PM.   HPI Erica Buckley is a 72 y.o. female who presents to the [facility] complaining of cough and fatigue, onset 5 days ago. She reports associated aching left ear pain, congestion, rhinorrhea with clear discharge, and decreased appetite. She also reports subjective fever, with chills, but denies having taken a temperature. She reports trying rest, drinking lots of liquids, and eating chicken noodle soup, with no relief. She also reports taking liquid Dayquil as well as Tylenol, and feeling better during the day as result, but having the symptoms reoccur in the evening. She states that her grandson recently had a cold and having been in proximity to him. She denies chest pain or pressure, sore throat, and any other associate symptoms. She denies h/o heart or lung problems. She reports currently being on her blood pressure medicine.   PCP - Laurann Montana  Results for orders placed during the hospital encounter of 01/13/10  POCT I-STAT, CHEM 8      Result Value Range   Sodium 142  135 - 145 mEq/L   Potassium 4.1  3.5 - 5.1 mEq/L   Chloride 107  96 - 112 mEq/L   BUN 18  6 - 23 mg/dL   Creatinine, Ser 0.8  0.4 - 1.2 mg/dL   Glucose, Bld 87  70 - 99 mg/dL   Calcium, Ion 0.27  2.53 - 1.32 mmol/L   TCO2 27  0 - 100 mmol/L   Hemoglobin 14.6  12.0 - 15.0 g/dL   HCT 66.4  40.3 - 47.4 %   Past Medical History  Diagnosis Date  . Anemia   . Ulcer   . Varicose veins    Past Surgical History  Procedure Laterality Date  . Abdominal hysterectomy    . Vein ligation and stripping  1978  . Endovenous ablation saphenous vein w/ laser  02-29-2012    right small saphenous vein  by Gretta Began MD  . Endovenous ablation saphenous vein w/ laser Left  04-11-2012    left greater saphenous by Gretta Began MD   History   Social History  . Marital Status: Married    Spouse Name: N/A    Number of Children: N/A  . Years of Education: N/A   Occupational History  . Not on file.   Social History Main Topics  . Smoking status: Never Smoker   . Smokeless tobacco: Never Used  . Alcohol Use: 0.6 oz/week    1 Glasses of wine per week  . Drug Use: No  . Sexual Activity: Not on file   Other Topics Concern  . Not on file   Social History Narrative  . No narrative on file   Allergies  Allergen Reactions  . Codeine Shortness Of Breath     Review of Systems  Constitutional: Positive for fever (subjective), chills, appetite change and fatigue.  HENT: Positive for congestion, ear pain (left ear ache) and rhinorrhea. Negative for sore throat.   Respiratory: Positive for cough. Negative for chest tightness and shortness of breath.   Cardiovascular: Negative for chest pain.  Skin: Negative for rash.  Neurological: Negative for dizziness and light-headedness.       Objective:   Physical Exam  Nursing note  and vitals reviewed. Constitutional: She is oriented to person, place, and time. She appears well-developed and well-nourished. No distress.  HENT:  Head: Normocephalic and atraumatic.  Right Ear: Hearing, tympanic membrane, external ear and ear canal normal.  Left Ear: Hearing, external ear and ear canal normal.  Nose: Nose normal.  Mouth/Throat: Oropharynx is clear and moist. No oropharyngeal exudate.  Clear fluid behind left TM. No retractions. Miminal tenderness to palpation to left maxillary sinus. Small amount of congestion bilaterally.    Eyes: Conjunctivae and EOM are normal. Pupils are equal, round, and reactive to light.  Cardiovascular: Normal rate, regular rhythm, normal heart sounds and intact distal pulses.   No murmur heard. Pulmonary/Chest: Effort normal and breath sounds normal. No respiratory distress. She has no  wheezes. She has no rhonchi.  Neurological: She is alert and oriented to person, place, and time.  Skin: Skin is warm and dry. No rash noted.  Psychiatric: She has a normal mood and affect. Her behavior is normal.   Filed Vitals:   11/15/12 1224  BP: 108/58  Pulse: 58  Temp: 98 F (36.7 C)  TempSrc: Oral  Resp: 16  Height: 5\' 6"  (1.676 m)  Weight: 231 lb (104.781 kg)  SpO2: 98%   DIAGNOSTIC STUDIES: Oxygen Saturation is 98% on room air, normal by my interpretation.       Assessment & Plan:   Erica Buckley is a 72 y.o. female Acute upper respiratory infections of unspecified site - Plan: azithromycin (ZITHROMAX) 250 MG tablet  Cough - Plan: azithromycin (ZITHROMAX) 250 MG tablet  Suspected viral URI, clear on exam, but if cough not improving in 4-5 days - can start z pak.  Sx care discussed. Also discussed increase in fluids, and check BP's at home - if running below 110 - could hold Toprol, but to call PCP or [place] if this occurs. rc precautions discussed.    Meds ordered this encounter  Medications  . azithromycin (ZITHROMAX) 250 MG tablet    Sig: Take 2 pills by mouth on day 1, then 1 pill by mouth per day on days 2 through 5.    Dispense:  6 each    Refill:  0   Patient Instructions  Saline nasal spray atleast 4 times per day, over the counter mucinex or mucinex DM if needed for cough, drink plenty of fluids.  Keep a record of your blood pressures outside of the office and if remains under 110 on upper number or lightheaded or dizzy - may need to stop Toprol temporarily.  Call your primary provider or return here or ER if this occurs.  If not improving in next 4 to 5 days - can fill antibiotic, but return to the clinic or go to the nearest emergency room if any of your symptoms worsen or new symptoms occur.  Upper Respiratory Infection, Adult An upper respiratory infection (URI) is also sometimes known as the common cold. The upper respiratory tract includes the  nose, sinuses, throat, trachea, and bronchi. Bronchi are the airways leading to the lungs. Most people improve within 1 week, but symptoms can last up to 2 weeks. A residual cough may last even longer.  CAUSES Many different viruses can infect the tissues lining the upper respiratory tract. The tissues become irritated and inflamed and often become very moist. Mucus production is also common. A cold is contagious. You can easily spread the virus to others by oral contact. This includes kissing, sharing a glass, coughing, or sneezing.  Touching your mouth or nose and then touching a surface, which is then touched by another person, can also spread the virus. SYMPTOMS  Symptoms typically develop 1 to 3 days after you come in contact with a cold virus. Symptoms vary from person to person. They may include:  Runny nose.  Sneezing.  Nasal congestion.  Sinus irritation.  Sore throat.  Loss of voice (laryngitis).  Cough.  Fatigue.  Muscle aches.  Loss of appetite.  Headache.  Low-grade fever. DIAGNOSIS  You might diagnose your own cold based on familiar symptoms, since most people get a cold 2 to 3 times a year. Your caregiver can confirm this based on your exam. Most importantly, your caregiver can check that your symptoms are not due to another disease such as strep throat, sinusitis, pneumonia, asthma, or epiglottitis. Blood tests, throat tests, and X-rays are not necessary to diagnose a common cold, but they may sometimes be helpful in excluding other more serious diseases. Your caregiver will decide if any further tests are required. RISKS AND COMPLICATIONS  You may be at risk for a more severe case of the common cold if you smoke cigarettes, have chronic heart disease (such as heart failure) or lung disease (such as asthma), or if you have a weakened immune system. The very young and very old are also at risk for more serious infections. Bacterial sinusitis, middle ear infections, and  bacterial pneumonia can complicate the common cold. The common cold can worsen asthma and chronic obstructive pulmonary disease (COPD). Sometimes, these complications can require emergency medical care and may be life-threatening. PREVENTION  The best way to protect against getting a cold is to practice good hygiene. Avoid oral or hand contact with people with cold symptoms. Wash your hands often if contact occurs. There is no clear evidence that vitamin C, vitamin E, echinacea, or exercise reduces the chance of developing a cold. However, it is always recommended to get plenty of rest and practice good nutrition. TREATMENT  Treatment is directed at relieving symptoms. There is no cure. Antibiotics are not effective, because the infection is caused by a virus, not by bacteria. Treatment may include:  Increased fluid intake. Sports drinks offer valuable electrolytes, sugars, and fluids.  Breathing heated mist or steam (vaporizer or shower).  Eating chicken soup or other clear broths, and maintaining good nutrition.  Getting plenty of rest.  Using gargles or lozenges for comfort.  Controlling fevers with ibuprofen or acetaminophen as directed by your caregiver.  Increasing usage of your inhaler if you have asthma. Zinc gel and zinc lozenges, taken in the first 24 hours of the common cold, can shorten the duration and lessen the severity of symptoms. Pain medicines may help with fever, muscle aches, and throat pain. A variety of non-prescription medicines are available to treat congestion and runny nose. Your caregiver can make recommendations and may suggest nasal or lung inhalers for other symptoms.  HOME CARE INSTRUCTIONS   Only take over-the-counter or prescription medicines for pain, discomfort, or fever as directed by your caregiver.  Use a warm mist humidifier or inhale steam from a shower to increase air moisture. This may keep secretions moist and make it easier to breathe.  Drink  enough water and fluids to keep your urine clear or pale yellow.  Rest as needed.  Return to work when your temperature has returned to normal or as your caregiver advises. You may need to stay home longer to avoid infecting others. You can also use  a face mask and careful hand washing to prevent spread of the virus. SEEK MEDICAL CARE IF:   After the first few days, you feel you are getting worse rather than better.  You need your caregiver's advice about medicines to control symptoms.  You develop chills, worsening shortness of breath, or brown or red sputum. These may be signs of pneumonia.  You develop yellow or brown nasal discharge or pain in the face, especially when you bend forward. These may be signs of sinusitis.  You develop a fever, swollen neck glands, pain with swallowing, or white areas in the back of your throat. These may be signs of strep throat. SEEK IMMEDIATE MEDICAL CARE IF:   You have a fever.  You develop severe or persistent headache, ear pain, sinus pain, or chest pain.  You develop wheezing, a prolonged cough, cough up blood, or have a change in your usual mucus (if you have chronic lung disease).  You develop sore muscles or a stiff neck. Document Released: 07/12/2000 Document Revised: 04/10/2011 Document Reviewed: 05/20/2010 [facility] Patient Information 2014 [place], [place].

## 2012-11-25 ENCOUNTER — Ambulatory Visit (HOSPITAL_COMMUNITY)
Admission: RE | Admit: 2012-11-25 | Discharge: 2012-11-25 | Disposition: A | Discharge status: Home / Self Care | Payer: Medicare PPO | Source: Ambulatory Visit | Attending: Family Medicine | Admitting: Family Medicine

## 2012-11-25 DIAGNOSIS — Z1231 Encounter for screening mammogram for malignant neoplasm of breast: Secondary | ICD-10-CM | POA: Insufficient documentation

## 2013-02-25 ENCOUNTER — Ambulatory Visit (INDEPENDENT_AMBULATORY_CARE_PROVIDER_SITE_OTHER): Payer: Medicare PPO | Admitting: Internal Medicine

## 2013-02-25 VITALS — BP 124/78 | HR 74 | Temp 98.3°F | Resp 16 | Ht 66.5 in | Wt 235.0 lb

## 2013-02-25 DIAGNOSIS — J029 Acute pharyngitis, unspecified: Secondary | ICD-10-CM

## 2013-02-25 LAB — POCT RAPID STREP A (OFFICE): RAPID STREP A SCREEN: NEGATIVE

## 2013-02-25 MED ORDER — AMOXICILLIN 875 MG PO TABS: 875.0000 mg | ORAL_TABLET | Freq: Two times a day (BID) | ORAL | Status: DC

## 2013-02-25 NOTE — Progress Notes (Signed)
Subjective:    Patient ID: Erica Buckley, female    DOB: 16-Oct-1940, 73 y.o.   MRN: [id number]  HPI This chart was scribed for [facility] by Smiley Houseman, Scribe. This patient was seen in room 2 and the patient's care was started at 10:09 AM.  HPI Comments: Erica Buckley is a 73 y.o. female who presents to the [facility] complaining of a constant sore throat with associated generalized myalgia, otalgia, and fatigue that started about 3 days ago.  Pt denies fever and cough, but states that she has been occassionally coughing up mucous.  Temperature is currently 98.59F.  Pt states her throat is worsened when she swallows.  She denies receiving the flu shot.      Past Surgical History  Procedure Laterality Date  . Abdominal hysterectomy    . Vein ligation and stripping  1978  . Endovenous ablation saphenous vein w/ laser  02-29-2012    right small saphenous vein  by Gretta Began MD  . Endovenous ablation saphenous vein w/ laser Left 04-11-2012    left greater saphenous by Gretta Began MD    Family History  Problem Relation Age of Onset  . Cancer Father   . Diabetes Father     History   Social History  . Marital Status: Married    Spouse Name: N/A    Number of Children: N/A  . Years of Education: N/A   Occupational History  . Not on file.   Social History Main Topics  . Smoking status: Never Smoker   . Smokeless tobacco: Never Used  . Alcohol Use: 0.6 oz/week    1 Glasses of wine per week  . Drug Use: No  . Sexual Activity: Not on file   Other Topics Concern  . Not on file   Social History Narrative  . No narrative on file    Allergies  Allergen Reactions  . Codeine Shortness Of Breath    Patient Active Problem List   Diagnosis Date Noted  . Pain in limb- Right leg 08/01/2012  . Aftercare following surgery of the circulatory system, NEC 03/07/2012  . Varicose veins of lower extremities with ulcer and inflammation  02/29/2012  . Unspecified venous (peripheral) insufficiency 02/29/2012  . Varicose veins of lower extremities with other complications 10/30/2011    Review of Systems  Constitutional: Positive for fatigue. Negative for fever and chills.  HENT: Positive for ear pain and sore throat. Negative for congestion, rhinorrhea and sneezing.   Respiratory: Negative for cough and shortness of breath.   Cardiovascular: Negative for chest pain.  Gastrointestinal: Negative for nausea, vomiting, abdominal pain and diarrhea.  Musculoskeletal: Positive for myalgias. Negative for back pain.  Skin: Negative for color change and rash.  Neurological: Negative for syncope and headaches.  All other systems reviewed and are negative.       Objective:   Physical Exam  Nursing note and vitals reviewed. Constitutional: She is oriented to person, place, and time. She appears well-developed and well-nourished. No distress.  HENT:  Head: Normocephalic and atraumatic.  Right Ear: External ear normal.  Left Ear: External ear normal.  Nose: Nose normal. No mucosal edema or rhinorrhea.  Mouth/Throat: Uvula is midline and mucous membranes are normal. Oropharyngeal exudate and posterior oropharyngeal erythema present. No posterior oropharyngeal edema.  Eyes: Conjunctivae and EOM are normal. Right eye exhibits no discharge. Left eye exhibits no discharge.  Neck: Neck supple. No tracheal deviation present.  Cardiovascular: Normal  rate, regular rhythm and normal heart sounds.   No murmur heard. Pulmonary/Chest: Effort normal and breath sounds normal. No respiratory distress. She has no wheezes. She has no rales.  Musculoskeletal: Normal range of motion.  Lymphadenopathy:    She has no cervical adenopathy.  Neurological: She is alert and oriented to person, place, and time.  Skin: Skin is warm and dry.  Psychiatric: She has a normal mood and affect. Her behavior is normal. Judgment and thought content normal.    Triage Vitals: BP 124/78  Pulse 74  Temp(Src) 98.3 F (36.8 C) (Oral)  Resp 16  Ht 5' 6.5" (1.689 m)  Wt 235 lb (106.595 kg)  BMI 37.37 kg/m2  SpO2 97%  Results for orders placed in visit on 02/25/13 (from the past 24 hour(s))  POCT RAPID STREP A (OFFICE)     Status: None   Collection Time    02/25/13 10:22 AM      Result Value Range   Rapid Strep A Screen Negative  Negative      Assessment & Plan:   I have completed the patient encounter in its entirety as documented by the scribe, with editing by me where necessary. Ashvin Adelson P. Merla Richesoolittle, M.D. Acute pharyngitis - Plan: POCT rapid strep A, Throat culture Loney Loh([facility])  call with culture results Meds ordered this encounter  Medications  . amoxicillin (AMOXIL) 875 MG tablet    Sig: Take 1 tablet (875 mg total) by mouth 2 (two) times daily.    Dispense:  20 tablet    Refill:  0

## 2013-02-27 LAB — CULTURE, GROUP A STREP: ORGANISM ID, BACTERIA: NORMAL

## 2013-04-28 ENCOUNTER — Ambulatory Visit: Payer: Medicare PPO | Admitting: Physical Therapy

## 2013-04-29 ENCOUNTER — Ambulatory Visit: Payer: Medicare PPO | Attending: Orthopedic Surgery | Admitting: Rehabilitation

## 2013-04-29 DIAGNOSIS — M25519 Pain in unspecified shoulder: Secondary | ICD-10-CM | POA: Diagnosis not present

## 2013-04-29 DIAGNOSIS — IMO0001 Reserved for inherently not codable concepts without codable children: Secondary | ICD-10-CM | POA: Insufficient documentation

## 2013-05-01 ENCOUNTER — Ambulatory Visit: Payer: Medicare PPO | Admitting: Rehabilitation

## 2013-05-07 ENCOUNTER — Ambulatory Visit: Payer: Medicare PPO | Admitting: Rehabilitation

## 2013-05-12 ENCOUNTER — Ambulatory Visit: Payer: Medicare PPO | Admitting: Rehabilitation

## 2013-05-14 ENCOUNTER — Ambulatory Visit (INDEPENDENT_AMBULATORY_CARE_PROVIDER_SITE_OTHER): Payer: Commercial Managed Care - HMO | Admitting: Family Medicine

## 2013-05-14 VITALS — BP 100/58 | HR 61 | Temp 97.9°F | Resp 16 | Ht 67.0 in | Wt 237.4 lb

## 2013-05-14 DIAGNOSIS — R0982 Postnasal drip: Secondary | ICD-10-CM | POA: Diagnosis not present

## 2013-05-14 DIAGNOSIS — R05 Cough: Secondary | ICD-10-CM

## 2013-05-14 DIAGNOSIS — J301 Allergic rhinitis due to pollen: Secondary | ICD-10-CM | POA: Diagnosis not present

## 2013-05-14 DIAGNOSIS — J019 Acute sinusitis, unspecified: Secondary | ICD-10-CM

## 2013-05-14 DIAGNOSIS — Z9109 Other allergy status, other than to drugs and biological substances: Secondary | ICD-10-CM

## 2013-05-14 DIAGNOSIS — R059 Cough, unspecified: Secondary | ICD-10-CM

## 2013-05-14 MED ORDER — AMOXICILLIN 875 MG PO TABS: 875.0000 mg | ORAL_TABLET | Freq: Two times a day (BID) | ORAL | Status: DC

## 2013-05-14 MED ORDER — PROMETHAZINE-DM 6.25-15 MG/5ML PO SYRP: 2.5000 mL | ORAL_SOLUTION | Freq: Every evening | ORAL | Status: DC | PRN

## 2013-05-14 MED ORDER — BENZONATATE 100 MG PO CAPS: 200.0000 mg | ORAL_CAPSULE | Freq: Two times a day (BID) | ORAL | Status: DC | PRN

## 2013-05-14 NOTE — Progress Notes (Signed)
Chief Complaint:  Chief Complaint  Patient presents with  . Cough    x 1 week    HPI: Erica Buckley is a 73 y.o. female who is here for  1 week history of sore throat, itchy eyes and coughing up yellow-clear sputum and also sinus HA and also pressure around her eyes.   Denies any fevers or chills. Has had ear pain. Has tried otc meds without relief. The cough is the thing that bothers her most at night. She denies allergies. She had a recent h/o sore throat and was given amox, both rapid and throat cx were negative.   Past Medical History  Diagnosis Date  . Anemia   . Ulcer   . Varicose veins    Past Surgical History  Procedure Laterality Date  . Abdominal hysterectomy    . Vein ligation and stripping  1978  . Endovenous ablation saphenous vein w/ laser  02-29-2012    right small saphenous vein  by Erica Beganodd Early MD  . Endovenous ablation saphenous vein w/ laser Left 04-11-2012    left greater saphenous by Erica Beganodd Early MD   History   Social History  . Marital Status: Married    Spouse Name: N/A    Number of Children: N/A  . Years of Education: N/A   Social History Main Topics  . Smoking status: Never Smoker   . Smokeless tobacco: Never Used  . Alcohol Use: 0.6 oz/week    1 Glasses of wine per week  . Drug Use: No  . Sexual Activity: None   Other Topics Concern  . None   Social History Narrative  . None   Family History  Problem Relation Age of Onset  . Cancer Father   . Diabetes Father    Allergies  Allergen Reactions  . Codeine Shortness Of Breath   Prior to Admission medications   Medication Sig Start Date End Date Taking? Authorizing Provider  losartan-hydrochlorothiazide (HYZAAR) 50-12.5 MG per tablet Take 1 tablet by mouth daily.   Yes Historical Provider, MD  metoprolol succinate (TOPROL-XL) 50 MG 24 hr tablet Take 50 mg by mouth daily. Take with or immediately following a meal.   Yes Historical Provider, MD  amoxicillin (AMOXIL) 875 MG  tablet Take 1 tablet (875 mg total) by mouth 2 (two) times daily. 02/25/13   Erica Pearsonobert P Doolittle, MD  azithromycin (ZITHROMAX) 250 MG tablet Take 2 pills by mouth on day 1, then 1 pill by mouth per day on days 2 through 5. 11/15/12   Shade FloodJeffrey R Greene, MD  ibuprofen (ADVIL,MOTRIN) 800 MG tablet Take 800 mg by mouth every 8 (eight) hours as needed.    Historical Provider, MD  valACYclovir (VALTREX) 500 MG tablet  12/12/11   Erica Muttonyan M Dunn, [name]     ROS: The patient denies fevers, chills, night sweats, unintentional weight loss, chest pain, palpitations, wheezing, dyspnea on exertion, nausea, vomiting, abdominal pain, dysuria, hematuria, melena, numbness, weakness, or tingling.   All other systems have been reviewed and were otherwise negative with the exception of those mentioned in the HPI and as above.    PHYSICAL EXAM: Filed Vitals:   05/14/13 1311  BP: 100/58  Pulse: 61  Temp: 97.9 F (36.6 C)  Resp: 16   Filed Vitals:   05/14/13 1311  Height: 5\' 7"  (1.702 m)  Weight: 237 lb 6.4 oz (107.684 kg)   Body mass index is 37.17 kg/(m^2).  General: Alert, no acute distress HEENT:  Normocephalic, atraumatic, oropharynx patent. EOMI, PERRLA, tm nl, + sinus tenderness, no exudates Cardiovascular:  Regular rate and rhythm, no rubs murmurs or gallops.  No Carotid bruits, radial pulse intact. No pedal edema.  Respiratory: Clear to auscultation bilaterally.  No wheezes, rales, or rhonchi.  No cyanosis, no use of accessory musculature GI: No organomegaly, abdomen is soft and non-tender, positive bowel sounds.  No masses. Skin: No rashes. Neurologic: Facial musculature symmetric. Psychiatric: Patient is appropriate throughout our interaction. Lymphatic: No cervical lymphadenopathy Musculoskeletal: Gait intact.   LABS: Results for orders placed in visit on 02/25/13  CULTURE, GROUP A STREP      Result Value Ref Range   Organism ID, Bacteria Normal Upper Respiratory Flora     Organism ID,  Bacteria No Beta Hemolytic Streptococci Isolated    POCT RAPID STREP A (OFFICE)      Result Value Ref Range   Rapid Strep A Screen Negative  Negative     EKG/XRAY:   Primary read [id number]   ASSESSMENT/PLAN: Encounter Diagnoses  Name Primary?  . Pollen allergies Yes  . Post-nasal drip   . Acute sinusitis   . Cough    Try otc meds first: nasacort, tessalon perles, prmethazine DM cough syrup If no improvement then may take amoxacillin F/u prn  Gross sideeffects, risk and benefits, and alternatives of medications d/w patient. Patient is aware that all medications have potential sideeffects and we are unable to predict every sideeffect or drug-drug interaction that may occur.  Erica Antuhao P Randolf Sansoucie, DO 05/15/2013 11:55 AM

## 2013-05-14 NOTE — Patient Instructions (Signed)

## 2013-05-15 ENCOUNTER — Ambulatory Visit: Payer: Medicare PPO | Admitting: Rehabilitation

## 2013-05-19 ENCOUNTER — Ambulatory Visit: Payer: Medicare PPO | Admitting: Rehabilitation

## 2013-05-22 ENCOUNTER — Ambulatory Visit: Payer: Medicare PPO | Admitting: Rehabilitation

## 2013-05-26 ENCOUNTER — Ambulatory Visit: Payer: Medicare PPO | Admitting: Rehabilitation

## 2013-05-29 ENCOUNTER — Ambulatory Visit: Payer: Medicare PPO | Admitting: Rehabilitation

## 2013-08-07 ENCOUNTER — Other Ambulatory Visit: Payer: Self-pay | Admitting: Orthopedic Surgery

## 2013-08-18 ENCOUNTER — Ambulatory Visit (HOSPITAL_COMMUNITY)
Admission: RE | Admit: 2013-08-18 | Discharge: 2013-08-18 | Disposition: A | Discharge status: Home / Self Care | Payer: Medicare PPO | Source: Ambulatory Visit | Attending: Orthopedic Surgery | Admitting: Orthopedic Surgery

## 2013-08-18 ENCOUNTER — Encounter (HOSPITAL_COMMUNITY): Payer: Self-pay

## 2013-08-18 ENCOUNTER — Encounter (HOSPITAL_COMMUNITY)
Admission: RE | Admit: 2013-08-18 | Discharge: 2013-08-18 | Disposition: A | Discharge status: Home / Self Care | Payer: Medicare PPO | Source: Ambulatory Visit | Attending: Orthopedic Surgery | Admitting: Orthopedic Surgery

## 2013-08-18 DIAGNOSIS — Z01818 Encounter for other preprocedural examination: Secondary | ICD-10-CM | POA: Insufficient documentation

## 2013-08-18 HISTORY — DX: Essential (primary) hypertension: I10

## 2013-08-18 HISTORY — DX: Nausea with vomiting, unspecified: Z98.890

## 2013-08-18 HISTORY — DX: Type 2 diabetes mellitus without complications: E11.9

## 2013-08-18 HISTORY — DX: Other specified postprocedural states: R11.2

## 2013-08-18 LAB — CBC WITH DIFFERENTIAL/PLATELET
BASOS PCT: 1 % (ref 0–1)
Basophils Absolute: 0 10*3/uL (ref 0.0–0.1)
EOS ABS: 0.2 10*3/uL (ref 0.0–0.7)
EOS PCT: 3 % (ref 0–5)
HEMATOCRIT: 38.1 % (ref 36.0–46.0)
Hemoglobin: 12.2 g/dL (ref 12.0–15.0)
LYMPHS ABS: 2.7 10*3/uL (ref 0.7–4.0)
Lymphocytes Relative: 52 % — ABNORMAL HIGH (ref 12–46)
MCH: 24.9 pg — AB (ref 26.0–34.0)
MCHC: 32 g/dL (ref 30.0–36.0)
MCV: 77.9 fL — ABNORMAL LOW (ref 78.0–100.0)
MONO ABS: 0.5 10*3/uL (ref 0.1–1.0)
Monocytes Relative: 10 % (ref 3–12)
Neutro Abs: 1.7 10*3/uL (ref 1.7–7.7)
Neutrophils Relative %: 34 % — ABNORMAL LOW (ref 43–77)
Platelets: 205 10*3/uL (ref 150–400)
RBC: 4.89 MIL/uL (ref 3.87–5.11)
RDW: 16.5 % — ABNORMAL HIGH (ref 11.5–15.5)
WBC: 5.2 10*3/uL (ref 4.0–10.5)

## 2013-08-18 LAB — COMPREHENSIVE METABOLIC PANEL
ALBUMIN: 4 g/dL (ref 3.5–5.2)
ALK PHOS: 97 U/L (ref 39–117)
ALT: 21 U/L (ref 0–35)
AST: 19 U/L (ref 0–37)
Anion gap: 12 (ref 5–15)
BUN: 15 mg/dL (ref 6–23)
CALCIUM: 9.5 mg/dL (ref 8.4–10.5)
CO2: 29 mEq/L (ref 19–32)
CREATININE: 0.78 mg/dL (ref 0.50–1.10)
Chloride: 103 mEq/L (ref 96–112)
GFR calc non Af Amer: 81 mL/min — ABNORMAL LOW (ref >90)
Glucose, Bld: 108 mg/dL — ABNORMAL HIGH (ref 70–99)
POTASSIUM: 4.3 meq/L (ref 3.7–5.3)
Sodium: 144 mEq/L (ref 137–147)
Total Bilirubin: 0.3 mg/dL (ref 0.3–1.2)
Total Protein: 7.8 g/dL (ref 6.0–8.3)

## 2013-08-18 LAB — NO BLOOD PRODUCTS

## 2013-08-18 LAB — PROTIME-INR
INR: 1.04 (ref 0.00–1.49)
PROTHROMBIN TIME: 13.6 s (ref 11.6–15.2)

## 2013-08-18 LAB — URINALYSIS, ROUTINE W REFLEX MICROSCOPIC
Bilirubin Urine: NEGATIVE
GLUCOSE, UA: NEGATIVE mg/dL
Hgb urine dipstick: NEGATIVE
KETONES UR: NEGATIVE mg/dL
Leukocytes, UA: NEGATIVE
Nitrite: NEGATIVE
Protein, ur: NEGATIVE mg/dL
Specific Gravity, Urine: 1.028 (ref 1.005–1.030)
UROBILINOGEN UA: 0.2 mg/dL (ref 0.0–1.0)
pH: 5.5 (ref 5.0–8.0)

## 2013-08-18 LAB — SURGICAL PCR SCREEN
MRSA, PCR: NEGATIVE
STAPHYLOCOCCUS AUREUS: NEGATIVE

## 2013-08-18 LAB — APTT: aPTT: 32 seconds (ref 24–37)

## 2013-08-18 NOTE — Progress Notes (Signed)
Primary - dr. Aram Beechamynthia white No cardiologist  ekg in dr. Cliffton AstersWhite office few weeks ago - will request

## 2013-08-18 NOTE — Pre-Procedure Instructions (Signed)
Erica Buckley  08/18/2013   Your procedure is scheduled on:  Monday, July 27th  [facility]45 AM.  Call this number if you have problems the morning of surgery: [phone]   Remember:   Do not eat food or drink liquids after midnight.   Take these medicines the morning of surgery with A SIP OF WATER: toprol  Stop taking aspirin, OTC vitamins/herbal medications, NSAIDS (ibuprofen, advil, motrin) 7 days prior to surgery.   Do not wear jewelry, make-up or nail polish.  Do not wear lotions, powders, or perfumes. You may wear deodorant.  Do not shave 48 hours prior to surgery. Men may shave face and neck.  Do not bring valuables to the hospital.  [facility] is not responsible  for any belongings or valuables.               Contacts, dentures or bridgework may not be worn into surgery.  Leave suitcase in the car. After surgery it may be brought to your room.  For patients admitted to the hospital, discharge time is determined by your  treatment team.              Please read over the following fact sheets that you were given: Pain Booklet, Coughing and Deep Breathing, Blood Transfusion Information, MRSA Information and Surgical Site Infection Prevention [place] - Preparing for Surgery  Before surgery, you can play an important role.  Because skin is not sterile, your skin needs to be as free of germs as possible.  You can reduce the number of germs on you skin by washing with CHG (chlorahexidine gluconate) soap before surgery.  CHG is an antiseptic cleaner which kills germs and bonds with the skin to continue killing germs even after washing.  Please DO NOT use if you have an allergy to CHG or antibacterial soaps.  If your skin becomes reddened/irritated stop using the CHG and inform your nurse when you arrive at Short Stay.  Do not shave (including legs and underarms) for at least 48 hours prior to the first CHG shower.  You may shave your  face.  Please follow these instructions carefully:   1.  Shower with CHG Soap the night before surgery and the morning of Surgery.  2.  If you choose to wash your hair, wash your hair first as usual with your normal shampoo.  3.  After you shampoo, rinse your hair and body thoroughly to remove the shampoo.  4.  Use CHG as you would any other liquid soap.  You can apply CHG directly to the skin and wash gently with scrungie or a clean washcloth.  5.  Apply the CHG Soap to your body ONLY FROM THE NECK DOWN.  Do not use on open wounds or open sores.  Avoid contact with your eyes, ears, mouth and genitals (private parts).  Wash genitals (private parts) with your normal soap.  6.  Wash thoroughly, paying special attention to the area where your surgery will be performed.  7.  Thoroughly rinse your body with warm water from the neck down.  8.  DO NOT shower/wash with your normal soap after using and rinsing off the CHG Soap.  9.  Pat yourself dry with a clean towel.            10.  Wear clean pajamas.            11.  Place clean sheets on your bed  the night of your first shower and do not sleep with pets.  Day of Surgery  Do not apply any lotions/deoderants the morning of surgery.  Please wear clean clothes to the hospital/surgery center.

## 2013-08-24 MED ORDER — CEFAZOLIN SODIUM-DEXTROSE 2-3 GM-% IV SOLR
2.0000 g | INTRAVENOUS | Status: AC
  Administered 2013-08-25: 2 g via INTRAVENOUS
  Filled 2013-08-24: qty 50

## 2013-08-25 ENCOUNTER — Encounter (HOSPITAL_COMMUNITY)
Admission: RE | Disposition: A | Discharge status: Skilled Nursing Facility | Payer: Medicare PPO | Source: Ambulatory Visit | Attending: Orthopedic Surgery

## 2013-08-25 ENCOUNTER — Encounter (HOSPITAL_COMMUNITY): Payer: Medicare PPO | Admitting: Anesthesiology

## 2013-08-25 ENCOUNTER — Inpatient Hospital Stay (HOSPITAL_COMMUNITY)
Admission: RE | Admit: 2013-08-25 | Discharge: 2013-08-28 | DRG: 470 | Disposition: A | Discharge status: Skilled Nursing Facility | Payer: Medicare PPO | Source: Ambulatory Visit | Attending: Orthopedic Surgery | Admitting: Orthopedic Surgery

## 2013-08-25 ENCOUNTER — Inpatient Hospital Stay (HOSPITAL_COMMUNITY): Payer: Medicare PPO | Admitting: Anesthesiology

## 2013-08-25 ENCOUNTER — Encounter (HOSPITAL_COMMUNITY): Payer: Self-pay | Admitting: Anesthesiology

## 2013-08-25 DIAGNOSIS — Z7982 Long term (current) use of aspirin: Secondary | ICD-10-CM

## 2013-08-25 DIAGNOSIS — Z833 Family history of diabetes mellitus: Secondary | ICD-10-CM | POA: Diagnosis not present

## 2013-08-25 DIAGNOSIS — Z96652 Presence of left artificial knee joint: Secondary | ICD-10-CM

## 2013-08-25 DIAGNOSIS — I10 Essential (primary) hypertension: Secondary | ICD-10-CM | POA: Diagnosis present

## 2013-08-25 DIAGNOSIS — Z79899 Other long term (current) drug therapy: Secondary | ICD-10-CM | POA: Diagnosis not present

## 2013-08-25 DIAGNOSIS — Z96659 Presence of unspecified artificial knee joint: Secondary | ICD-10-CM

## 2013-08-25 DIAGNOSIS — E119 Type 2 diabetes mellitus without complications: Secondary | ICD-10-CM | POA: Diagnosis present

## 2013-08-25 DIAGNOSIS — D62 Acute posthemorrhagic anemia: Secondary | ICD-10-CM | POA: Diagnosis not present

## 2013-08-25 DIAGNOSIS — M1710 Unilateral primary osteoarthritis, unspecified knee: Principal | ICD-10-CM | POA: Diagnosis present

## 2013-08-25 DIAGNOSIS — M25569 Pain in unspecified knee: Secondary | ICD-10-CM | POA: Diagnosis present

## 2013-08-25 HISTORY — PX: TOTAL KNEE ARTHROPLASTY: SHX125

## 2013-08-25 LAB — GLUCOSE, CAPILLARY
GLUCOSE-CAPILLARY: 192 mg/dL — AB (ref 70–99)
Glucose-Capillary: 112 mg/dL — ABNORMAL HIGH (ref 70–99)
Glucose-Capillary: 118 mg/dL — ABNORMAL HIGH (ref 70–99)

## 2013-08-25 SURGERY — ARTHROPLASTY, KNEE, TOTAL
Anesthesia: General | Site: Knee | Laterality: Left

## 2013-08-25 MED ORDER — ZOLPIDEM TARTRATE 5 MG PO TABS: 5.0000 mg | ORAL_TABLET | Freq: Every evening | ORAL | Status: DC | PRN

## 2013-08-25 MED ORDER — FENTANYL CITRATE 0.05 MG/ML IJ SOLN
INTRAMUSCULAR | Status: AC
  Filled 2013-08-25: qty 5

## 2013-08-25 MED ORDER — SENNOSIDES-DOCUSATE SODIUM 8.6-50 MG PO TABS: 1.0000 | ORAL_TABLET | Freq: Every evening | ORAL | Status: DC | PRN

## 2013-08-25 MED ORDER — OXYCODONE HCL 5 MG PO TABS
5.0000 mg | ORAL_TABLET | ORAL | Status: DC | PRN
  Administered 2013-08-25 – 2013-08-28 (×12): 10 mg via ORAL
  Filled 2013-08-25 (×13): qty 2

## 2013-08-25 MED ORDER — BUPIVACAINE HCL (PF) 0.25 % IJ SOLN: INTRAMUSCULAR | Status: DC | PRN

## 2013-08-25 MED ORDER — METHOCARBAMOL 500 MG PO TABS
500.0000 mg | ORAL_TABLET | Freq: Four times a day (QID) | ORAL | Status: DC | PRN
  Administered 2013-08-25 – 2013-08-27 (×5): 500 mg via ORAL
  Filled 2013-08-25 (×4): qty 1

## 2013-08-25 MED ORDER — LACTATED RINGERS IV SOLN
INTRAVENOUS | Status: DC
  Administered 2013-08-26 – 2013-08-27 (×2): via INTRAVENOUS

## 2013-08-25 MED ORDER — BUPIVACAINE HCL (PF) 0.25 % IJ SOLN
INTRAMUSCULAR | Status: AC
  Filled 2013-08-25: qty 30

## 2013-08-25 MED ORDER — MIDAZOLAM HCL 2 MG/2ML IJ SOLN
INTRAMUSCULAR | Status: AC
  Administered 2013-08-25: 2 mg
  Filled 2013-08-25: qty 2

## 2013-08-25 MED ORDER — METHOCARBAMOL 500 MG PO TABS
ORAL_TABLET | ORAL | Status: AC
  Administered 2013-08-25: 500 mg via ORAL
  Filled 2013-08-25: qty 1

## 2013-08-25 MED ORDER — BUPIVACAINE HCL (PF) 0.5 % IJ SOLN
INTRAMUSCULAR | Status: DC | PRN
  Administered 2013-08-25: 25 mL via PERINEURAL

## 2013-08-25 MED ORDER — SODIUM CHLORIDE 0.9 % IJ SOLN
INTRAMUSCULAR | Status: DC | PRN
  Administered 2013-08-25: 40 mL via INTRAVENOUS

## 2013-08-25 MED ORDER — DIPHENHYDRAMINE HCL 12.5 MG/5ML PO ELIX: 12.5000 mg | ORAL_SOLUTION | ORAL | Status: DC | PRN

## 2013-08-25 MED ORDER — FLEET ENEMA 7-19 GM/118ML RE ENEM: 1.0000 | ENEMA | Freq: Once | RECTAL | Status: AC | PRN

## 2013-08-25 MED ORDER — CEFAZOLIN SODIUM 1-5 GM-% IV SOLN
1.0000 g | Freq: Four times a day (QID) | INTRAVENOUS | Status: AC
  Administered 2013-08-25 – 2013-08-26 (×2): 1 g via INTRAVENOUS
  Filled 2013-08-25 (×2): qty 50

## 2013-08-25 MED ORDER — DOCUSATE SODIUM 100 MG PO CAPS
100.0000 mg | ORAL_CAPSULE | Freq: Two times a day (BID) | ORAL | Status: DC
  Administered 2013-08-25 – 2013-08-28 (×6): 100 mg via ORAL
  Filled 2013-08-25 (×6): qty 1

## 2013-08-25 MED ORDER — ROCURONIUM BROMIDE 100 MG/10ML IV SOLN
INTRAVENOUS | Status: DC | PRN
  Administered 2013-08-25: 50 mg via INTRAVENOUS

## 2013-08-25 MED ORDER — ONDANSETRON HCL 4 MG/2ML IJ SOLN
INTRAMUSCULAR | Status: AC
  Filled 2013-08-25: qty 2

## 2013-08-25 MED ORDER — ONDANSETRON HCL 4 MG/2ML IJ SOLN
INTRAMUSCULAR | Status: DC | PRN
  Administered 2013-08-25: 4 mg via INTRAVENOUS

## 2013-08-25 MED ORDER — METOCLOPRAMIDE HCL 5 MG/ML IJ SOLN: 10.0000 mg | Freq: Once | INTRAMUSCULAR | Status: DC | PRN

## 2013-08-25 MED ORDER — METOCLOPRAMIDE HCL 5 MG/ML IJ SOLN: 5.0000 mg | Freq: Three times a day (TID) | INTRAMUSCULAR | Status: DC | PRN

## 2013-08-25 MED ORDER — BUPIVACAINE LIPOSOME 1.3 % IJ SUSP
INTRAMUSCULAR | Status: DC | PRN
  Administered 2013-08-25: 20 mL

## 2013-08-25 MED ORDER — LACTATED RINGERS IV SOLN
INTRAVENOUS | Status: DC
  Administered 2013-08-25: 11:00:00 via INTRAVENOUS

## 2013-08-25 MED ORDER — MORPHINE SULFATE 2 MG/ML IJ SOLN: 2.0000 mg | INTRAMUSCULAR | Status: DC | PRN

## 2013-08-25 MED ORDER — LACTATED RINGERS IV SOLN
INTRAVENOUS | Status: DC | PRN
  Administered 2013-08-25 (×2): via INTRAVENOUS

## 2013-08-25 MED ORDER — DEXAMETHASONE SODIUM PHOSPHATE 4 MG/ML IJ SOLN
INTRAMUSCULAR | Status: DC | PRN
  Administered 2013-08-25: 4 mg via INTRAVENOUS

## 2013-08-25 MED ORDER — METOPROLOL SUCCINATE ER 50 MG PO TB24
50.0000 mg | ORAL_TABLET | Freq: Every day | ORAL | Status: DC
  Administered 2013-08-26 – 2013-08-28 (×3): 50 mg via ORAL
  Filled 2013-08-25 (×3): qty 1

## 2013-08-25 MED ORDER — ONDANSETRON HCL 4 MG/2ML IJ SOLN: 4.0000 mg | Freq: Four times a day (QID) | INTRAMUSCULAR | Status: DC | PRN

## 2013-08-25 MED ORDER — GLYCOPYRROLATE 0.2 MG/ML IJ SOLN
INTRAMUSCULAR | Status: DC | PRN
  Administered 2013-08-25: .8 mg via INTRAVENOUS

## 2013-08-25 MED ORDER — ACETAMINOPHEN 500 MG PO TABS
1000.0000 mg | ORAL_TABLET | Freq: Four times a day (QID) | ORAL | Status: AC
  Administered 2013-08-26 (×3): 1000 mg via ORAL
  Filled 2013-08-25 (×4): qty 2

## 2013-08-25 MED ORDER — KETOROLAC TROMETHAMINE 15 MG/ML IJ SOLN
7.5000 mg | Freq: Four times a day (QID) | INTRAMUSCULAR | Status: AC | PRN
  Administered 2013-08-25: 7.5 mg via INTRAVENOUS

## 2013-08-25 MED ORDER — FENTANYL CITRATE 0.05 MG/ML IJ SOLN
25.0000 ug | INTRAMUSCULAR | Status: DC | PRN
  Administered 2013-08-25 (×3): 50 ug via INTRAVENOUS

## 2013-08-25 MED ORDER — FENTANYL CITRATE 0.05 MG/ML IJ SOLN
50.0000 ug | INTRAMUSCULAR | Status: DC | PRN
Start: 2013-08-25 — End: 2013-08-25

## 2013-08-25 MED ORDER — LOSARTAN POTASSIUM-HCTZ 50-12.5 MG PO TABS
1.0000 | ORAL_TABLET | Freq: Every day | ORAL | Status: DC
Start: 2013-08-25 — End: 2013-08-25

## 2013-08-25 MED ORDER — HYDROCHLOROTHIAZIDE 12.5 MG PO CAPS
12.5000 mg | ORAL_CAPSULE | Freq: Every day | ORAL | Status: DC
  Administered 2013-08-27 – 2013-08-28 (×2): 12.5 mg via ORAL
  Filled 2013-08-25 (×4): qty 1

## 2013-08-25 MED ORDER — ACETAMINOPHEN 325 MG PO TABS
650.0000 mg | ORAL_TABLET | Freq: Four times a day (QID) | ORAL | Status: DC | PRN
  Administered 2013-08-27: 650 mg via ORAL
  Filled 2013-08-25 (×2): qty 2

## 2013-08-25 MED ORDER — METOCLOPRAMIDE HCL 10 MG PO TABS: 5.0000 mg | ORAL_TABLET | Freq: Three times a day (TID) | ORAL | Status: DC | PRN

## 2013-08-25 MED ORDER — LOSARTAN POTASSIUM 50 MG PO TABS
50.0000 mg | ORAL_TABLET | Freq: Every day | ORAL | Status: DC
  Administered 2013-08-26 – 2013-08-28 (×3): 50 mg via ORAL
  Filled 2013-08-25 (×4): qty 1

## 2013-08-25 MED ORDER — CHLORHEXIDINE GLUCONATE 4 % EX LIQD
60.0000 mL | Freq: Once | CUTANEOUS | Status: DC
  Filled 2013-08-25: qty 60

## 2013-08-25 MED ORDER — PROPOFOL 10 MG/ML IV BOLUS
INTRAVENOUS | Status: AC
  Filled 2013-08-25: qty 20

## 2013-08-25 MED ORDER — ACETAMINOPHEN 650 MG RE SUPP: 650.0000 mg | Freq: Four times a day (QID) | RECTAL | Status: DC | PRN

## 2013-08-25 MED ORDER — ONDANSETRON HCL 4 MG PO TABS: 4.0000 mg | ORAL_TABLET | Freq: Four times a day (QID) | ORAL | Status: DC | PRN

## 2013-08-25 MED ORDER — FERROUS SULFATE 325 (65 FE) MG PO TABS
325.0000 mg | ORAL_TABLET | Freq: Three times a day (TID) | ORAL | Status: DC
  Administered 2013-08-26: 325 mg via ORAL
  Filled 2013-08-25 (×5): qty 1

## 2013-08-25 MED ORDER — EPHEDRINE SULFATE 50 MG/ML IJ SOLN
INTRAMUSCULAR | Status: DC | PRN
  Administered 2013-08-25: 10 mg via INTRAVENOUS

## 2013-08-25 MED ORDER — FENTANYL CITRATE 0.05 MG/ML IJ SOLN
INTRAMUSCULAR | Status: AC
  Administered 2013-08-25: 50 ug via INTRAVENOUS
  Filled 2013-08-25: qty 2

## 2013-08-25 MED ORDER — ROCURONIUM BROMIDE 50 MG/5ML IV SOLN
INTRAVENOUS | Status: AC
  Filled 2013-08-25: qty 1

## 2013-08-25 MED ORDER — LIDOCAINE HCL (CARDIAC) 20 MG/ML IV SOLN
INTRAVENOUS | Status: DC | PRN
  Administered 2013-08-25: 60 mg via INTRAVENOUS

## 2013-08-25 MED ORDER — KETOROLAC TROMETHAMINE 15 MG/ML IJ SOLN
INTRAMUSCULAR | Status: AC
  Filled 2013-08-25: qty 1

## 2013-08-25 MED ORDER — MIDAZOLAM HCL 2 MG/2ML IJ SOLN: 1.0000 mg | INTRAMUSCULAR | Status: DC | PRN

## 2013-08-25 MED ORDER — PROPOFOL 10 MG/ML IV BOLUS
INTRAVENOUS | Status: DC | PRN
  Administered 2013-08-25: 200 mg via INTRAVENOUS

## 2013-08-25 MED ORDER — BUPIVACAINE LIPOSOME 1.3 % IJ SUSP
20.0000 mL | INTRAMUSCULAR | Status: AC
  Filled 2013-08-25: qty 20

## 2013-08-25 MED ORDER — CEFUROXIME SODIUM 1.5 G IJ SOLR: INTRAMUSCULAR | Status: DC | PRN

## 2013-08-25 MED ORDER — MENTHOL 3 MG MT LOZG: 1.0000 | LOZENGE | OROMUCOSAL | Status: DC | PRN

## 2013-08-25 MED ORDER — PHENOL 1.4 % MT LIQD: 1.0000 | OROMUCOSAL | Status: DC | PRN

## 2013-08-25 MED ORDER — FENTANYL CITRATE 0.05 MG/ML IJ SOLN
INTRAMUSCULAR | Status: DC | PRN
  Administered 2013-08-25 (×3): 50 ug via INTRAVENOUS

## 2013-08-25 MED ORDER — TRANEXAMIC ACID 100 MG/ML IV SOLN
1000.0000 mg | INTRAVENOUS | Status: DC
  Filled 2013-08-25: qty 10

## 2013-08-25 MED ORDER — NEOSTIGMINE METHYLSULFATE 10 MG/10ML IV SOLN
INTRAVENOUS | Status: DC | PRN
  Administered 2013-08-25: 5 mg via INTRAVENOUS

## 2013-08-25 MED ORDER — BISACODYL 10 MG RE SUPP: 10.0000 mg | Freq: Every day | RECTAL | Status: DC | PRN

## 2013-08-25 MED ORDER — ASPIRIN EC 325 MG PO TBEC
325.0000 mg | DELAYED_RELEASE_TABLET | Freq: Every day | ORAL | Status: DC
  Administered 2013-08-26: 325 mg via ORAL
  Filled 2013-08-25 (×2): qty 1

## 2013-08-25 MED ORDER — CEFUROXIME SODIUM 1.5 G IJ SOLR
INTRAMUSCULAR | Status: AC
  Filled 2013-08-25: qty 1.5

## 2013-08-25 MED ORDER — SODIUM CHLORIDE 0.9 % IR SOLN
Status: DC | PRN
  Administered 2013-08-25: 1000 mL

## 2013-08-25 MED ORDER — GLIMEPIRIDE 1 MG PO TABS
1.0000 mg | ORAL_TABLET | Freq: Every day | ORAL | Status: DC
  Administered 2013-08-26 – 2013-08-28 (×3): 1 mg via ORAL
  Filled 2013-08-25 (×4): qty 1

## 2013-08-25 MED ORDER — DEXTROSE 5 % IV SOLN
500.0000 mg | Freq: Four times a day (QID) | INTRAVENOUS | Status: DC | PRN
  Filled 2013-08-25: qty 5

## 2013-08-25 MED ORDER — TRANEXAMIC ACID 100 MG/ML IV SOLN
10000.0000 mg | INTRAVENOUS | Status: DC | PRN
  Administered 2013-08-25: 1000 mg via INTRAVENOUS

## 2013-08-25 MED ORDER — LIDOCAINE HCL (CARDIAC) 20 MG/ML IV SOLN
INTRAVENOUS | Status: AC
Start: 2013-08-25 — End: 2013-08-25
  Filled 2013-08-25: qty 5

## 2013-08-25 MED ORDER — ALUM & MAG HYDROXIDE-SIMETH 200-200-20 MG/5ML PO SUSP: 30.0000 mL | ORAL | Status: DC | PRN

## 2013-08-25 SURGICAL SUPPLY — 63 items
APL SKNCLS STERI-STRIP NONHPOA (GAUZE/BANDAGES/DRESSINGS) ×1
BANDAGE ESMARK 6X9 LF (GAUZE/BANDAGES/DRESSINGS) ×1 IMPLANT
BENZOIN TINCTURE PRP APPL 2/3 (GAUZE/BANDAGES/DRESSINGS) ×2 IMPLANT
BLADE SAGITTAL 25.0X1.19X90 (BLADE) ×2 IMPLANT
BLADE SAW SAG 90X13X1.27 (BLADE) ×2 IMPLANT
BNDG CMPR 9X6 STRL LF SNTH (GAUZE/BANDAGES/DRESSINGS) ×1
BNDG ESMARK 6X9 LF (GAUZE/BANDAGES/DRESSINGS) ×2
BOWL SMART MIX CTS (DISPOSABLE) ×2 IMPLANT
CAPT RP KNEE ×1 IMPLANT
CEMENT HV SMART SET (Cement) ×4 IMPLANT
CLSR STERI-STRIP ANTIMIC 1/2X4 (GAUZE/BANDAGES/DRESSINGS) ×1 IMPLANT
COVER SURGICAL LIGHT HANDLE (MISCELLANEOUS) ×2 IMPLANT
CUFF TOURNIQUET SINGLE 34IN LL (TOURNIQUET CUFF) ×2 IMPLANT
CUFF TOURNIQUET SINGLE 44IN (TOURNIQUET CUFF) IMPLANT
DRAPE EXTREMITY T 121X128X90 (DRAPE) ×2 IMPLANT
DRAPE U-SHAPE 47X51 STRL (DRAPES) ×2 IMPLANT
DRSG PAD ABDOMINAL 8X10 ST (GAUZE/BANDAGES/DRESSINGS) ×2 IMPLANT
DURAPREP 26ML APPLICATOR (WOUND CARE) ×2 IMPLANT
ELECT REM PT RETURN 9FT ADLT (ELECTROSURGICAL) ×2
ELECTRODE REM PT RTRN 9FT ADLT (ELECTROSURGICAL) ×1 IMPLANT
EVACUATOR 1/8 PVC DRAIN (DRAIN) ×2 IMPLANT
FACESHIELD WRAPAROUND (MASK) ×2 IMPLANT
FACESHIELD WRAPAROUND OR TEAM (MASK) ×1 IMPLANT
GAUZE XEROFORM 5X9 LF (GAUZE/BANDAGES/DRESSINGS) ×2 IMPLANT
GLOVE BIOGEL PI IND STRL 8 (GLOVE) ×2 IMPLANT
GLOVE BIOGEL PI INDICATOR 8 (GLOVE) ×2
GLOVE ECLIPSE 7.5 STRL STRAW (GLOVE) ×4 IMPLANT
GOWN STRL REUS W/ TWL LRG LVL3 (GOWN DISPOSABLE) ×1 IMPLANT
GOWN STRL REUS W/ TWL XL LVL3 (GOWN DISPOSABLE) ×2 IMPLANT
GOWN STRL REUS W/TWL LRG LVL3 (GOWN DISPOSABLE) ×2
GOWN STRL REUS W/TWL XL LVL3 (GOWN DISPOSABLE) ×4
HANDPIECE INTERPULSE COAX TIP (DISPOSABLE) ×2
HOOD PEEL AWAY FACE SHEILD DIS (HOOD) ×6 IMPLANT
IMMOBILIZER KNEE 20 (SOFTGOODS) IMPLANT
IMMOBILIZER KNEE 22 UNIV (SOFTGOODS) ×2 IMPLANT
KIT BASIN OR (CUSTOM PROCEDURE TRAY) ×2 IMPLANT
KIT ROOM TURNOVER OR (KITS) ×2 IMPLANT
MANIFOLD NEPTUNE II (INSTRUMENTS) ×2 IMPLANT
MARKER SKIN DUAL TIP RULER LAB (MISCELLANEOUS) ×1 IMPLANT
NDL HYPO 25GX1X1/2 BEV (NEEDLE) IMPLANT
NEEDLE HYPO 25GX1X1/2 BEV (NEEDLE) IMPLANT
NS IRRIG 1000ML POUR BTL (IV SOLUTION) ×2 IMPLANT
PACK TOTAL JOINT (CUSTOM PROCEDURE TRAY) ×2 IMPLANT
PAD ARMBOARD 7.5X6 YLW CONV (MISCELLANEOUS) ×4 IMPLANT
PAD CAST 4YDX4 CTTN HI CHSV (CAST SUPPLIES) ×1 IMPLANT
PADDING CAST COTTON 4X4 STRL (CAST SUPPLIES) ×2
SET HNDPC FAN SPRY TIP SCT (DISPOSABLE) ×1 IMPLANT
SPONGE GAUZE 4X4 12PLY (GAUZE/BANDAGES/DRESSINGS) ×2 IMPLANT
SPONGE GAUZE 4X4 12PLY STER LF (GAUZE/BANDAGES/DRESSINGS) ×1 IMPLANT
STAPLER VISISTAT 35W (STAPLE) IMPLANT
STRIP CLOSURE SKIN 1/2X4 (GAUZE/BANDAGES/DRESSINGS) ×2 IMPLANT
SUCTION FRAZIER TIP 10 FR DISP (SUCTIONS) ×2 IMPLANT
SUT MNCRL AB 3-0 PS2 18 (SUTURE) IMPLANT
SUT VIC AB 0 CTB1 27 (SUTURE) ×4 IMPLANT
SUT VIC AB 1 CT1 27 (SUTURE) ×4
SUT VIC AB 1 CT1 27XBRD ANBCTR (SUTURE) ×2 IMPLANT
SUT VIC AB 2-0 CTB1 (SUTURE) ×4 IMPLANT
SYR 50ML LL SCALE MARK (SYRINGE) ×2 IMPLANT
SYR CONTROL 10ML LL (SYRINGE) IMPLANT
TOWEL OR 17X24 6PK STRL BLUE (TOWEL DISPOSABLE) ×2 IMPLANT
TOWEL OR 17X26 10 PK STRL BLUE (TOWEL DISPOSABLE) ×2 IMPLANT
TRAY FOLEY CATH 16FRSI W/METER (SET/KITS/TRAYS/PACK) ×2 IMPLANT
WATER STERILE IRR 1000ML POUR (IV SOLUTION) ×4 IMPLANT

## 2013-08-25 NOTE — Brief Op Note (Signed)
08/25/2013  2:33 PM  PATIENT:  Erica Buckley  73 y.o. female  PRE-OPERATIVE DIAGNOSIS:  OSTEOARTHRITIS  POST-OPERATIVE DIAGNOSIS:  OSTEOARTHRITIS  PROCEDURE:  Procedure(s): LEFT TOTAL KNEE ARTHROPLASTY (Left)  SURGEON:  Surgeon(s) and Role:    * Harvie JuniorJohn L Vivek Grealish, MD - Primary  PHYSICIAN ASSISTANT:   ASSISTANTS: justin rnfa  ANESTHESIA:   general  EBL:  Total I/O In: 1000 [I.V.:1000] Out: 225 [Urine:75; Blood:150]  BLOOD ADMINISTERED:none  DRAINS: (1 med) Hemovact drain(s) in the l knee with  Suction Open   LOCAL MEDICATIONS USED:  OTHER experel  SPECIMEN:  No Specimen  DISPOSITION OF SPECIMEN:  N/A  COUNTS:  YES  TOURNIQUET:   Total Tourniquet Time Documented: Thigh (Left) - 59 minutes Total: Thigh (Left) - 59 minutes   DICTATION: .Other Dictation: Dictation Number [id number]  PLAN OF CARE: Admit to inpatient   PATIENT DISPOSITION:  PACU - hemodynamically stable.   Delay start of Pharmacological VTE agent (>24hrs) due to surgical blood loss or risk of bleeding: no

## 2013-08-25 NOTE — Evaluation (Signed)
Physical Therapy Evaluation Patient Details Name: Erica CrankerCatherine L Buckley MRN: [id number] DOB: 1940-08-13 Today's Date: 08/25/2013   History of Present Illness  Patient is a 73 yo female admitted 08/25/13 now s/p Lt TKA.  PMH:  anemia, HTN, DM, headaches.  Clinical Impression  Patient presents with problems listed below.  Will benefit from acute PT to maximize independence prior to discharge.    Follow Up Recommendations SNF;Supervision/Assistance - 24 hour    Equipment Recommendations  Rolling walker with 5" wheels;3in1 (PT)    Recommendations for Other Services       Precautions / Restrictions Precautions Precautions: Knee Precaution Booklet Issued: Yes (comment) Precaution Comments: Reviewed precautions with patient. Required Braces or Orthoses:  (No orders, but KI in room.  Asked for clarification.) Restrictions Weight Bearing Restrictions:  (No orders.  Asked for clarification by MD.)      Mobility  Bed Mobility Overal bed mobility: Needs Assistance Bed Mobility: Supine to Sit;Sit to Supine     Supine to sit: Min assist Sit to supine: Min assist   General bed mobility comments: Verbal cues for technique.  Assist to move LLE off of bed.  Once upright, patient able to maintain sitting balance with min guard assist.  Patient sat EOB x 10 minutes.  Transfers                 General transfer comment: Not attempted - no weight bearing status ordered.  Asked MD for clarification in sticky note.  Ambulation/Gait                Stairs            Wheelchair Mobility    Modified Rankin (Stroke Patients Only)       Balance                                             Pertinent Vitals/Pain Pain 5/10 in Lt knee with mobility.    Home Living Family/patient expects to be discharged to:: Skilled nursing facility [facility] Living Arrangements: Other relatives             Home Equipment: None      Prior Function Level  of Independence: Independent               Hand Dominance        Extremity/Trunk Assessment   Upper Extremity Assessment: Overall WFL for tasks assessed           Lower Extremity Assessment: LLE deficits/detail   LLE Deficits / Details: Decreased strength/ROM post-op  Cervical / Trunk Assessment: Normal  Communication   Communication: No difficulties  Cognition Arousal/Alertness: Awake/alert Behavior During Therapy: WFL for tasks assessed/performed Overall Cognitive Status: Within Functional Limits for tasks assessed                      General Comments      Exercises Total Joint Exercises Ankle Circles/Pumps: AROM;Both;10 reps;Seated      Assessment/Plan    PT Assessment Patient needs continued PT services  PT Diagnosis Difficulty walking;Acute pain   PT Problem List Decreased strength;Decreased range of motion;Decreased activity tolerance;Decreased balance;Decreased mobility;Decreased knowledge of use of DME;Decreased knowledge of precautions;Pain  PT Treatment Interventions DME instruction;Gait training;Functional mobility training;Therapeutic activities;Therapeutic exercise;Patient/family education   PT Goals (Current goals can be found in the Care Plan section) Acute Rehab  PT Goals Patient Stated Goal: To get stronger PT Goal Formulation: With patient Time For Goal Achievement: 09/01/13 Potential to Achieve Goals: Good    Frequency 7X/week   Barriers to discharge        Co-evaluation               End of Session Equipment Utilized During Treatment: Oxygen Activity Tolerance: Patient tolerated treatment well Patient left: in bed;with call bell/phone within reach;with family/visitor present Nurse Communication: Mobility status (Need orders for weightbearing status and knee immobilizer)         Time: 1929-1955 PT Time Calculation (min): 26 min   Charges:   PT Evaluation $Initial PT Evaluation Tier I: 1 Procedure PT  Treatments $Therapeutic Activity: 8-22 mins   PT G Codes:          Vena Austria 08/25/2013, 8:20 PM Durenda Hurt. Renaldo Fiddler, [facility] Acute Rehab Services Pager [phone]

## 2013-08-25 NOTE — Progress Notes (Signed)
Orthopedic Tech Progress Note Patient Details:  Erica Buckley 1940-04-15 [id number]  CPM Left Knee CPM Left Knee: On Left Knee Flexion (Degrees): 60 Left Knee Extension (Degrees): 0 Additional Comments: trapeze bar patient helper   [name], [name] 08/25/2013, 3:58 PM

## 2013-08-25 NOTE — H&P (Signed)
TOTAL KNEE ADMISSION H&P  Patient is being admitted for left total knee arthroplasty.  Subjective:  Chief Complaint:left knee pain.  HPI: Erica Buckley, 73 y.o. female, has a history of pain and functional disability in the left knee due to arthritis and has failed non-surgical conservative treatments for greater than 12 weeks to includeNSAID's and/or analgesics, corticosteriod injections, viscosupplementation injections, flexibility and strengthening excercises, use of assistive devices, weight reduction as appropriate and activity modification.  Onset of symptoms was gradual, starting 4 years ago with gradually worsening course since that time. The patient noted no past surgery on the left knee(s).  Patient currently rates pain in the left knee(s) at 8 out of 10 with activity. Patient has night pain, worsening of pain with activity and weight bearing, pain that interferes with activities of daily living, pain with passive range of motion, crepitus and joint swelling.  Patient has evidence of  by imaging studies. This patient has had failure of all reasonable conservative care. There is no active infection.  Patient Active Problem List   Diagnosis Date Noted  . Pain in limb- Right leg 08/01/2012  . Aftercare following surgery of the circulatory system, [place] 03/07/2012  . Varicose veins of lower extremities with ulcer and inflammation 02/29/2012  . Unspecified venous (peripheral) insufficiency 02/29/2012  . Varicose veins of lower extremities with other complications 01/74/9449   Past Medical History  Diagnosis Date  . Anemia   . Ulcer   . Varicose veins   . PONV (postoperative nausea and vomiting)   . Hypertension   . Diabetes mellitus without complication     fasting 110s  . QPRFFMBW(466.5)     Past Surgical History  Procedure Laterality Date  . Abdominal hysterectomy    . Vein ligation and stripping  1978  . Endovenous ablation saphenous vein w/ laser  02-29-2012    right  small saphenous vein  by Curt Jews MD  . Endovenous ablation saphenous vein w/ laser Left 04-11-2012    left greater saphenous by Curt Jews MD  . Knee arthroscopy Left     Prescriptions prior to admission  Medication Sig Dispense Refill  . diclofenac sodium (VOLTAREN) 1 % GEL Apply 2-4 g topically 3 (three) times daily as needed (for pain).      Marland Kitchen glimepiride (AMARYL) 1 MG tablet Take 1 mg by mouth daily with breakfast.      . ibuprofen (ADVIL,MOTRIN) 800 MG tablet Take 800 mg by mouth 2 (two) times daily as needed for mild pain.       Marland Kitchen losartan-hydrochlorothiazide (HYZAAR) 50-12.5 MG per tablet Take 1 tablet by mouth daily.      . metoprolol succinate (TOPROL-XL) 50 MG 24 hr tablet Take 50 mg by mouth daily. Take with or immediately following a meal.       Allergies  Allergen Reactions  . Codeine Shortness Of Breath    History  Substance Use Topics  . Smoking status: Never Smoker   . Smokeless tobacco: Never Used  . Alcohol Use: 0.6 oz/week    1 Glasses of wine per week    Family History  Problem Relation Age of Onset  . Cancer Father   . Diabetes Father      ROS ROS: I have reviewed the patient's review of systems thoroughly and there are no positive responses as relates to the HPI. Objective:  Physical Exam  Vital signs in last 24 hours: Temp:  [98.4 F (36.9 C)] 98.4 F (36.9 C) (07/27 1044)  Pulse Rate:  [54-68] 54 (07/27 1205) Resp:  [8-20] 17 (07/27 1205) BP: (105-168)/(53-69) 112/53 mmHg (07/27 1205) SpO2:  [97 %-100 %] 100 % (07/27 1205) Weight:  [233 lb (105.688 kg)] 233 lb (105.688 kg) (07/27 1044) Well-developed well-nourished patient in no acute distress. Alert and oriented x3 HEENT:within normal limits Cardiac: Regular rate and rhythm Pulmonary: Lungs clear to auscultation Abdomen: Soft and nontender.  Normal active bowel sounds  Musculoskeletal: (Left knee: Painful range of motion.  Range of motion 5-95.  Mild valgus malalignment.  No  instability. Labs: Recent Results (from the past 2160 hour(s))  NO BLOOD PRODUCTS     Status: None   Collection Time    08/18/13 10:15 AM      Result Value Ref Range   Transfuse no blood products       Value: TRANSFUSE NO BLOOD PRODUCTS, VERIFIED BY STACY CARTER,RN  SURGICAL PCR SCREEN     Status: None   Collection Time    08/18/13 10:21 AM      Result Value Ref Range   MRSA, PCR NEGATIVE  NEGATIVE   Staphylococcus aureus NEGATIVE  NEGATIVE   Comment:            The Xpert SA Assay (FDA     approved for NASAL specimens     in patients over 34 years of age),     is one component of     a comprehensive surveillance     program.  Test performance has     been validated by [employer] for patients greater     than or equal to 70 year old.     It is not intended     to diagnose infection nor to     guide or monitor treatment.  URINALYSIS, ROUTINE W REFLEX MICROSCOPIC     Status: None   Collection Time    08/18/13 10:21 AM      Result Value Ref Range   Color, Urine YELLOW  YELLOW   APPearance CLEAR  CLEAR   Specific Gravity, Urine 1.028  1.005 - 1.030   pH 5.5  5.0 - 8.0   Glucose, UA NEGATIVE  NEGATIVE mg/dL   Hgb urine dipstick NEGATIVE  NEGATIVE   Bilirubin Urine NEGATIVE  NEGATIVE   Ketones, ur NEGATIVE  NEGATIVE mg/dL   Protein, ur NEGATIVE  NEGATIVE mg/dL   Urobilinogen, UA 0.2  0.0 - 1.0 mg/dL   Nitrite NEGATIVE  NEGATIVE   Leukocytes, UA NEGATIVE  NEGATIVE   Comment: MICROSCOPIC NOT DONE ON URINES WITH NEGATIVE PROTEIN, BLOOD, LEUKOCYTES, NITRITE, OR GLUCOSE <1000 mg/dL.  APTT     Status: None   Collection Time    08/18/13 10:23 AM      Result Value Ref Range   aPTT 32  24 - 37 seconds  CBC WITH DIFFERENTIAL     Status: Abnormal   Collection Time    08/18/13 10:23 AM      Result Value Ref Range   WBC 5.2  4.0 - 10.5 K/uL   RBC 4.89  3.87 - 5.11 MIL/uL   Hemoglobin 12.2  12.0 - 15.0 g/dL   HCT 38.1  36.0 - 46.0 %   MCV 77.9 (*) 78.0 - 100.0 fL   MCH 24.9  (*) 26.0 - 34.0 pg   MCHC 32.0  30.0 - 36.0 g/dL   RDW 16.5 (*) 11.5 - 15.5 %   Platelets 205  150 - 400 K/uL  Neutrophils Relative % 34 (*) 43 - 77 %   Neutro Abs 1.7  1.7 - 7.7 K/uL   Lymphocytes Relative 52 (*) 12 - 46 %   Lymphs Abs 2.7  0.7 - 4.0 K/uL   Monocytes Relative 10  3 - 12 %   Monocytes Absolute 0.5  0.1 - 1.0 K/uL   Eosinophils Relative 3  0 - 5 %   Eosinophils Absolute 0.2  0.0 - 0.7 K/uL   Basophils Relative 1  0 - 1 %   Basophils Absolute 0.0  0.0 - 0.1 K/uL  COMPREHENSIVE METABOLIC PANEL     Status: Abnormal   Collection Time    08/18/13 10:23 AM      Result Value Ref Range   Sodium 144  137 - 147 mEq/L   Potassium 4.3  3.7 - 5.3 mEq/L   Chloride 103  96 - 112 mEq/L   CO2 29  19 - 32 mEq/L   Glucose, Bld 108 (*) 70 - 99 mg/dL   BUN 15  6 - 23 mg/dL   Creatinine, Ser 0.78  0.50 - 1.10 mg/dL   Calcium 9.5  8.4 - 10.5 mg/dL   Total Protein 7.8  6.0 - 8.3 g/dL   Albumin 4.0  3.5 - 5.2 g/dL   AST 19  0 - 37 U/L   ALT 21  0 - 35 U/L   Alkaline Phosphatase 97  39 - 117 U/L   Total Bilirubin 0.3  0.3 - 1.2 mg/dL   GFR calc non Af Amer 81 (*) >90 mL/min   GFR calc Af Amer >90  >90 mL/min   Comment: (NOTE)     The eGFR has been calculated using the CKD EPI equation.     This calculation has not been validated in all clinical situations.     eGFR's persistently <90 mL/min signify possible Chronic Kidney     Disease.   Anion gap 12  5 - 15  PROTIME-INR     Status: None   Collection Time    08/18/13 10:23 AM      Result Value Ref Range   Prothrombin Time 13.6  11.6 - 15.2 seconds   INR 1.04  0.00 - 1.49  GLUCOSE, CAPILLARY     Status: Abnormal   Collection Time    08/25/13 10:46 AM      Result Value Ref Range   Glucose-Capillary 112 (*) 70 - 99 mg/dL      Estimated body mass index is 37.05 kg/(m^2) as calculated from the following:   Height as of 08/18/13: 5' 6.5" (1.689 m).   Weight as of this encounter: 233 lb (105.688 kg).   Imaging Review Plain  radiographs demonstrate severe degenerative joint disease of the left knee(s). The overall alignment ismild valgus. The bone quality appears to be good for age and reported activity level.  Assessment/Plan:  End stage arthritis, left knee   The patient history, physical examination, clinical judgment of the provider and imaging studies are consistent with end stage degenerative joint disease of the left knee(s) and total knee arthroplasty is deemed medically necessary. The treatment options including medical management, injection therapy arthroscopy and arthroplasty were discussed at length. The risks and benefits of total knee arthroplasty were presented and reviewed. The risks due to aseptic loosening, infection, stiffness, patella tracking problems, thromboembolic complications and other imponderables were discussed. The patient acknowledged the explanation, agreed to proceed with the plan and consent was signed. Patient is being admitted for  inpatient treatment for surgery, pain control, PT, OT, prophylactic antibiotics, VTE prophylaxis, progressive ambulation and ADL's and discharge planning. The patient is planning to be discharged home with home health services

## 2013-08-25 NOTE — Anesthesia Preprocedure Evaluation (Signed)
Anesthesia Evaluation  Patient identified by MRN, date of birth, ID band Patient awake    Reviewed: Allergy & Precautions, H&P , NPO status , Patient's Chart, lab work & pertinent test results, reviewed documented beta blocker date and time   History of Anesthesia Complications (+) PONV and history of anesthetic complications  Airway Mallampati: II TM Distance: >3 FB Neck ROM: full    Dental   Pulmonary neg pulmonary ROS,  breath sounds clear to auscultation        Cardiovascular hypertension, On Medications and On Home Beta Blockers + Peripheral Vascular Disease Rhythm:regular     Neuro/Psych  Headaches, negative psych ROS   GI/Hepatic negative GI ROS, Neg liver ROS,   Endo/Other  diabetes, Oral Hypoglycemic AgentsMorbid obesity  Renal/GU negative Renal ROS  negative genitourinary   Musculoskeletal   Abdominal   Peds  Hematology negative hematology ROS (+)   Anesthesia Other Findings See surgeon's H&P   Reproductive/Obstetrics negative OB ROS                           Anesthesia Physical Anesthesia Plan  ASA: III  Anesthesia Plan: General   Post-op Pain Management:    Induction: Intravenous  Airway Management Planned: Oral ETT  Additional Equipment:   Intra-op Plan:   Post-operative Plan: Extubation in OR  Informed Consent: I have reviewed the patients History and Physical, chart, labs and discussed the procedure including the risks, benefits and alternatives for the proposed anesthesia with the patient or authorized representative who has indicated his/her understanding and acceptance.   Dental Advisory Given  Plan Discussed with: CRNA and Surgeon  Anesthesia Plan Comments:         Anesthesia Quick Evaluation

## 2013-08-25 NOTE — Anesthesia Procedure Notes (Signed)
Anesthesia Regional Block:  Adductor canal block  Pre-Anesthetic Checklist: ,, timeout performed, Correct Patient, Correct Site, Correct Laterality, Correct Procedure, Correct Position, site marked, Risks and benefits discussed,  Surgical consent,  Pre-op evaluation,  At surgeon's request and post-op pain management  Laterality: Left  Prep: chloraprep       Needles:   Needle Type: Other     Needle Length: 9cm 9 cm Needle Gauge: 21 and 21 G    Additional Needles:  Procedures: ultrasound guided (picture in chart) Adductor canal block Narrative:  Start time: 08/25/2013 11:23 AM End time: 08/25/2013 11:30 AM Injection made incrementally with aspirations every 5 mL.  Performed by: Personally  Anesthesiologist: C. Ulice Follett MD  Additional Notes: Ultrasound guidance used to: id relevant anatomy, confirm needle position, local anesthetic spread, avoidance of vascular puncture. Picture saved. No complications. Block performed personally by Janetta Horaharles E. Gelene MinkFrederick, MD

## 2013-08-25 NOTE — Transfer of Care (Signed)
Immediate Anesthesia Transfer of Care Note  Patient: Erica Buckley  Procedure(s) Performed: Procedure(s): LEFT TOTAL KNEE ARTHROPLASTY (Left)  Patient Location: PACU  Anesthesia Type:General  Level of Consciousness: awake, alert  and oriented  Airway & Oxygen Therapy: Patient Spontanous Breathing  Post-op Assessment: Report given to PACU RN  Post vital signs: Reviewed and stable  Complications: No apparent anesthesia complications

## 2013-08-25 NOTE — Progress Notes (Signed)
Orthopedic Tech Progress Note Patient Details:  Erica Buckley Sep 14, 1940 [id number]  Patient ID: Erica Crankeratherine L Buckley, female   DOB: Sep 14, 1940, 73 y.o.   MRN: [id number] Placed pt's lle in cpm @0 -60 degrees @2005   [name], Towana Stenglein 08/25/2013, 8:06 PM

## 2013-08-26 ENCOUNTER — Encounter (HOSPITAL_COMMUNITY): Payer: Self-pay | Admitting: *Deleted

## 2013-08-26 LAB — GLUCOSE, CAPILLARY
GLUCOSE-CAPILLARY: 126 mg/dL — AB (ref 70–99)
GLUCOSE-CAPILLARY: 98 mg/dL (ref 70–99)
Glucose-Capillary: 115 mg/dL — ABNORMAL HIGH (ref 70–99)
Glucose-Capillary: 119 mg/dL — ABNORMAL HIGH (ref 70–99)

## 2013-08-26 LAB — BASIC METABOLIC PANEL
ANION GAP: 11 (ref 5–15)
BUN: 13 mg/dL (ref 6–23)
CALCIUM: 8.4 mg/dL (ref 8.4–10.5)
CO2: 24 meq/L (ref 19–32)
Chloride: 104 mEq/L (ref 96–112)
Creatinine, Ser: 0.86 mg/dL (ref 0.50–1.10)
GFR calc Af Amer: 76 mL/min — ABNORMAL LOW (ref >90)
GFR calc non Af Amer: 65 mL/min — ABNORMAL LOW (ref >90)
GLUCOSE: 126 mg/dL — AB (ref 70–99)
POTASSIUM: 3.8 meq/L (ref 3.7–5.3)
SODIUM: 139 meq/L (ref 137–147)

## 2013-08-26 LAB — CBC
HCT: 28.5 % — ABNORMAL LOW (ref 36.0–46.0)
Hemoglobin: 9.1 g/dL — ABNORMAL LOW (ref 12.0–15.0)
MCH: 24.3 pg — AB (ref 26.0–34.0)
MCHC: 31.9 g/dL (ref 30.0–36.0)
MCV: 76.2 fL — ABNORMAL LOW (ref 78.0–100.0)
PLATELETS: 176 10*3/uL (ref 150–400)
RBC: 3.74 MIL/uL — ABNORMAL LOW (ref 3.87–5.11)
RDW: 16 % — ABNORMAL HIGH (ref 11.5–15.5)
WBC: 10.3 10*3/uL (ref 4.0–10.5)

## 2013-08-26 MED ORDER — SODIUM CHLORIDE 0.9 % IV BOLUS (SEPSIS)
500.0000 mL | Freq: Once | INTRAVENOUS | Status: AC
  Administered 2013-08-26: 500 mL via INTRAVENOUS

## 2013-08-26 MED ORDER — FERROUS SULFATE 325 (65 FE) MG PO TABS
325.0000 mg | ORAL_TABLET | Freq: Every day | ORAL | Status: DC
  Administered 2013-08-27 – 2013-08-28 (×2): 325 mg via ORAL
  Filled 2013-08-26 (×3): qty 1

## 2013-08-26 MED ORDER — ASPIRIN EC 325 MG PO TBEC
325.0000 mg | DELAYED_RELEASE_TABLET | Freq: Two times a day (BID) | ORAL | Status: DC
  Administered 2013-08-26 – 2013-08-28 (×4): 325 mg via ORAL
  Filled 2013-08-26 (×5): qty 1

## 2013-08-26 NOTE — Progress Notes (Signed)
Utilization review completed.  

## 2013-08-26 NOTE — Progress Notes (Signed)
FL2 and PASRR completed and faxed out to facilities.   Howard PouchDoris Jamieson Lisa,LCSWA [phone]

## 2013-08-26 NOTE — Progress Notes (Signed)
Orthopedic Tech Progress Note Patient Details:  Lamount CrankerCatherine L Ditmore 02/17/1940 [id number]  Patient ID: Lamount Crankeratherine L Dirico, female   DOB: 02/17/1940, 73 y.o.   MRN: [id number] Placed pt's lle in cpm @ 0-50 degrees; will mincrease as [pt tolerates; rn notified  [name], Daily Crate 08/26/2013, 2:43 PM

## 2013-08-26 NOTE — Anesthesia Postprocedure Evaluation (Signed)
Anesthesia Post Note  Patient: Erica Buckley  Procedure(s) Performed: Procedure(s) (LRB): LEFT TOTAL KNEE ARTHROPLASTY (Left)  Anesthesia type: General  Patient location: PACU  Post pain: Pain level controlled  Post assessment: Patient's Cardiovascular Status Stable  Last Vitals:  Filed Vitals:   08/26/13 0412  BP: 108/50  Pulse: 64  Temp: 36.9 C  Resp: 16    Post vital signs: Reviewed and stable  Level of consciousness: alert  Complications: No apparent anesthesia complications

## 2013-08-26 NOTE — Progress Notes (Signed)
Physical Therapy Treatment Patient Details Name: Erica CrankerCatherine L Buckley MRN: [id number] DOB: [birth year] Today's Date: 08/26/2013    History of Present Illness Patient is a 73 yo female admitted 08/25/13 now s/p Lt TKA.  PMH:  anemia, HTN, DM, headaches.    PT Comments    Still waiting on weight bearing status from doctor. Performed bedside there ex and a stand pivot transfer to chair with pt attempting to put no to little weight as possible until clarification from doctor. Plan to work on amb next session.   Follow Up Recommendations  SNF;Supervision/Assistance - 24 hour     Equipment Recommendations  Rolling walker with 5" wheels;3in1 (PT)    Recommendations for Other Services       Precautions / Restrictions Precautions Precautions: Knee Restrictions Weight Bearing Restrictions: Yes (No orders.  Asked for clarification by MD.)    Mobility  Bed Mobility Overal bed mobility: Needs Assistance Bed Mobility: Supine to Sit     Supine to sit: Min assist     General bed mobility comments: verbal cues and instruction for technique. Assist to move LLE off of bed. Once upright, pt able to maintain sitting balance and scoot to edge of bed with min guard for safety.   Transfers Overall transfer level: Needs assistance Equipment used: Rolling walker (2 wheeled) Transfers: Sit to/from UGI CorporationStand;Stand Pivot Transfers Sit to Stand: Mod assist Stand pivot transfers: Min guard       General transfer comment: Pt needed mod assist to power from standing. Had trouble shifting weight onto feet and getting balance due to first time up OOB. Pt has some dizziness and nausea when she came to standing. Able to stand pivot with min guard for safety and verbal cues for technique.  Ambulation/Gait                 Stairs            Wheelchair Mobility    Modified Rankin (Stroke Patients Only)       Balance                                    Cognition  Arousal/Alertness: Awake/alert Behavior During Therapy: WFL for tasks assessed/performed Overall Cognitive Status: Within Functional Limits for tasks assessed                      Exercises Total Joint Exercises Ankle Circles/Pumps: AROM;Seated;Both;10 reps Quad Sets: AROM;Seated;Left;10 reps Heel Slides: AAROM;Seated;Left;5 reps Straight Leg Raises: AROM;Seated;10 reps;Left    General Comments        Pertinent Vitals/Pain no apparent distress. Pt repositioned in recliner for comfort.      Home Living                      Prior Function            PT Goals (current goals can now be found in the care plan section) Progress towards PT goals: Progressing toward goals    Frequency  7X/week    PT Plan      Co-evaluation             End of Session Equipment Utilized During Treatment: Gait belt Activity Tolerance: Patient tolerated treatment well Patient left: in chair;with call bell/phone within reach     Time: 1019-1054 PT Time Calculation (min): 35 min  Charges:  G Codes:      BRASFIELD,Candi Profit,SPTA 08/26/2013, 11:04 AM

## 2013-08-26 NOTE — Progress Notes (Signed)
Subjective: 1 Day Post-Op Procedure(s) (LRB): LEFT TOTAL KNEE ARTHROPLASTY (Left) Patient reports pain as mild.    Objective: Vital signs in last 24 hours: Temp:  [97.4 F (36.3 C)-98.4 F (36.9 C)] 98.4 F (36.9 C) (07/28 0412) Pulse Rate:  [54-88] 64 (07/28 0412) Resp:  [8-20] 16 (07/28 0412) BP: (97-168)/(45-85) 108/50 mmHg (07/28 0412) SpO2:  [97 %-100 %] 99 % (07/28 0412) Weight:  [233 lb (105.688 kg)] 233 lb (105.688 kg) (07/27 1044)  Intake/Output from previous day: 07/27 0701 - 07/28 0700 In: 1150 [I.V.:1150] Out: 1375 [Urine:1025; Drains:200; Blood:150] Intake/Output this shift:     Recent Labs  08/26/13 0440  HGB 9.1*    Recent Labs  08/26/13 0440  WBC 10.3  RBC 3.74*  HCT 28.5*  PLT 176    Recent Labs  08/26/13 0440  NA 139  K 3.8  CL 104  CO2 24  BUN 13  CREATININE 0.86  GLUCOSE 126*  CALCIUM 8.4   No results found for this basename: LABPT, INR,  in the last 72 hours  Neurologically intact ABD soft Neurovascular intact Sensation intact distally Intact pulses distally Dorsiflexion/Plantar flexion intact No cellulitis present Compartment soft  Assessment/Plan: 1 Day Post-Op Procedure(s) (LRB): LEFT TOTAL KNEE ARTHROPLASTY (Left) Advance diet Up with therapy Discharge to SNF when available  Analysia Dungee L 08/26/2013, 8:06 AM

## 2013-08-26 NOTE — Op Note (Signed)
NAMGlenis Smoker:  [place], [place]          ACCOUNT NO.:  [id number]  MEDICAL RECORD NO.:  [id number]  LOCATION:  5N12C                        FACILITY:  [facility]  PHYSICIAN:  Harvie JuniorJohn L. Yailine Ballard, M.D.   DATE OF BIRTH:  1940-02-24  DATE OF PROCEDURE:  08/25/2013 DATE OF DISCHARGE:                              OPERATIVE REPORT   PREOPERATIVE DIAGNOSIS:  End-stage degenerative joint disease, left knee.  POSTOPERATIVE DIAGNOSIS:  End-stage degenerative joint disease, left knee.  PROCEDURE:  Left total knee replacement with a Sigma system, size 4 narrow femur, size 3 tibia, 10-mm bridging bearing, and a 35-mm all- polyethylene patella.  SURGEON:  Harvie JuniorJohn L. Demarrion Meiklejohn, M.D.  ASSISTANTSJill Alexanders:  Justin, RNFA.  ANESTHESIA:  General.  BRIEF HISTORY:  Ms. Claudell Kylerumpler is a 73 year old female with long history of significant complaints of left knee pain.  She had been treated conservatively for prolonged period of time.  After failure of all conservative care including activity modification, injection therapy, viscous supplementation, and use of a cane.  The patient was taken to the operating room for left total knee replacement.  She had night pain and light activity pain and x-ray showing bone-on-bone change.  DESCRIPTION OF PROCEDURE:  The patient was taken to the operating room. After adequate anesthesia was obtained with general anesthetic, the patient was placed supine on the operating table.  Left leg was prepped and draped in usual sterile fashion.  Following this, the leg was exsanguinated.  Blood pressure tourniquet inflated to 350 mmHg. Following this, a midline incision was made in the subcutaneous tissue dissected down to the level of extensor mechanism and medial parapatellar arthrotomy was undertaken.  Following this, the medial and lateral meniscal were removed.  The retropatellar fat pad, synovium, the anterior aspect of the femur, and anterior-posterior cruciates. Following this, the tibia was cut  perpendicular long axis with an extramedullary guide, and the pilot hole drilled in a distal femurs, cut perpendicular to the anatomic axis with a 5-degree valgus inclination. Spacer blocks put in place at this point.  The patient can come into full extension.  Following this, the femur sized to a 4.  Anterior and posterior cuts were made, chamfer and box and following this, attention turned to the tibia, sized to a 3, measured, drilled, and keeled, and trial components were put in place, size 4 narrow femur with a size 3 tibia with a 10-mm bridging bearing, comes in nicely into full extension and there is no excess slop in the flexion gap.  At this time, attention turned to the patella, cut down to the level of 14 mm, 35 paddle was chosen and the lugs were drilled for this and the lugs drilled for the femur.  Trial components were removed and the knee was copiously and thoroughly lavaged with pulsatile lavage irrigation and suctioned dry. Once this was done, the final components were cemented in place size 3 tibia, 4 narrow femur, a 10-mm bridging bearing trial was placed, and a 35 all-poly patella was held with a clamp.  All excess bone cement was removed and the cement was allowed to completely harden.  Once the cement was completely hardened, the tourniquet was let down.  All bleeding was controlled with electrocautery.  Attention turned back to the knee where a 12.5 poly was trialed but really knee can get into extension with that.  At this point, she again was irrigated and suctioned dry and the medium Hemovac drain was placed.  The medial parapatellar arthrotomy was closed and this was after the final 10 poly trial was placed.  The medial parapatellar was closed with 1 Vicryl running, skin with 0 and 2-0 Vicryl and 3-0 Monocryl subcuticular. Benzoin and Steri-Strips were applied.  Sterile compressive dressing was applied and the patient to recovery room and she was noted to be  in satisfactory condition.  Estimated blood for the procedure is minimal.     Harvie Junior, M.D.     Ranae Plumber  D:  08/25/2013  T:  08/26/2013  Job:  [id number]

## 2013-08-26 NOTE — Evaluation (Signed)
Occupational Therapy Evaluation Patient Details Name: Erica Buckley MRN: [id number] DOB: [birth year] Today's Date: 08/26/2013    History of Present Illness Patient is a 73 yo female admitted 08/25/13 now s/p Lt TKA.  PMH:  anemia, HTN, DM, headaches.   Clinical Impression   Prior to admission, pt was performing ADL and IADL independently.  Currently, she can perform seated bathing and dressing with set up, but requires moderate assistance for LB ADL and is not able to release grasp of walker for standing activities.  Educated pt in use of AE for LB ADL and use of 3 in 1 as BSC and for shower seat once she returns home. Pt with prior plans for SNF rehab.  Will defer continued OT to SNF.    Follow Up Recommendations  SNF;Supervision/Assistance - 24 hour    Equipment Recommendations   (defer to next venue)    Recommendations for Other Services       Precautions / Restrictions Precautions Precautions: Knee Restrictions Weight Bearing Restrictions: Yes      Mobility Bed Mobility Overal bed mobility:  (pt up in chair)              Transfers Overall transfer level: Needs assistance Equipment used: Rolling walker (2 wheeled) Transfers: Stand Pivot Transfers Sit to Stand: Mod assist Stand pivot transfers: Min guard       General transfer comment: assist to power up in standing, verbal cues for technique    Balance                                            ADL Overall ADL's : Needs assistance/impaired Eating/Feeding: Independent;Sitting   Grooming: Wash/dry hands;Wash/dry face;Sitting;Set up   Upper Body Bathing: Set up;Sitting   Lower Body Bathing: Moderate assistance;Sit to/from stand   Upper Body Dressing : Set up;Sitting   Lower Body Dressing: Moderate assistance;Sit to/from stand   Toilet Transfer: Minimal assistance;Stand-pivot;BSC;RW   Toileting- Clothing Manipulation and Hygiene: Minimal assistance;Sit to/from stand          General ADL Comments: Educated pt in adaptive equipment for LB bathing and dressing, safe footwear, and mulitple uses of 3 in 1.     Vision                     Perception     Praxis      Pertinent Vitals/Pain Back of knee, educated not to put pillow under knee, ankle pumps help, pt is premedicated     Hand Dominance Right   Extremity/Trunk Assessment Upper Extremity Assessment Upper Extremity Assessment: Overall WFL for tasks assessed   Lower Extremity Assessment Lower Extremity Assessment: Defer to PT evaluation   Cervical / Trunk Assessment Cervical / Trunk Assessment: Normal   Communication Communication Communication: No difficulties   Cognition Arousal/Alertness: Awake/alert Behavior During Therapy: WFL for tasks assessed/performed Overall Cognitive Status: Within Functional Limits for tasks assessed                     General Comments       Exercises       Shoulder Instructions      Home Living Family/patient expects to be discharged to:: Skilled nursing facility  Home Equipment: Grab bars - tub/shower;Grab bars - toilet          Prior Functioning/Environment Level of Independence: Independent             OT Diagnosis: Generalized weakness;Acute pain   OT Problem List: Decreased strength;Decreased activity tolerance;Impaired balance (sitting and/or standing);Decreased knowledge of use of DME or AE;Obesity;Pain   OT Treatment/Interventions:      OT Goals(Current goals can be found in the care plan section) Acute Rehab OT Goals Patient Stated Goal: To get stronger  OT Frequency:     Barriers to D/C:            Co-evaluation              End of Session    Activity Tolerance: Patient tolerated treatment well Patient left: in chair;with call bell/phone within reach   Time: 1135-1155 OT Time Calculation (min): 20 min Charges:  OT General Charges $OT Visit: 1  Procedure OT Evaluation $Initial OT Evaluation Tier I: 1 Procedure OT Treatments $Self Care/Home Management : 8-22 mins G-Codes:    Evern Bio 08/26/2013, 12:13 PM [phone]

## 2013-08-26 NOTE — Progress Notes (Signed)
Seen and agreed 08/26/2013 [name], [name] PTA [phone] pager [phone] office    

## 2013-08-26 NOTE — Progress Notes (Signed)
Physical Therapy Treatment Patient Details Name: Erica Buckley MRN: [id number] DOB: 1940-02-03 Today's Date: 08/26/2013    History of Present Illness Patient is a 73 yo female admitted 08/25/13 now s/p Lt TKA.  PMH:  anemia, HTN, DM, headaches.    PT Comments    Pt is progressing well with mobility. Pt was able to get OOB and amb this session. Put pt back in CPM at end of session since pt was in CPM when I arrived.   Follow Up Recommendations  SNF;Supervision/Assistance - 24 hour     Equipment Recommendations  Rolling walker with 5" wheels;3in1 (PT)    Recommendations for Other Services       Precautions / Restrictions Precautions Precautions: Knee Restrictions Weight Bearing Restrictions: Yes    Mobility  Bed Mobility Overal bed mobility: Needs Assistance Bed Mobility: Supine to Sit     Supine to sit: Min assist Sit to supine: Min assist   General bed mobility comments: verbal cues and instruction for technique. Assist to move LLE off of bed. Once upright, pt able to maintain sitting balance and scoot to edge of bed with min guard for safety. uses assist from hand rails to come to sitting from supine  Transfers Overall transfer level: Needs assistance Equipment used: Rolling walker (2 wheeled) Transfers: Sit to/from Stand Sit to Stand: Mod assist Stand pivot transfers: Min guard       General transfer comment: mod assist from bed at low level. Raised bed and only needed min guard. pt has difficulty shifting weight between feet when coming to standing and getting steady.   Ambulation/Gait Ambulation/Gait assistance: Min guard Ambulation Distance (Feet): 30 Feet Assistive device: Rolling walker (2 wheeled) Gait Pattern/deviations: Step-to pattern;Decreased stride length Gait velocity: decreased Gait velocity interpretation: Below normal speed for age/gender General Gait Details: Pt did well with sequencing. verbal cues for posture.    Stairs            Wheelchair Mobility    Modified Rankin (Stroke Patients Only)       Balance                                    Cognition Arousal/Alertness: Awake/alert Behavior During Therapy: WFL for tasks assessed/performed Overall Cognitive Status: Within Functional Limits for tasks assessed                      Exercises      General Comments        Pertinent Vitals/Pain no apparent distress. Pt repositioned in bed for comfort in CPM machine.     Home Living Family/patient expects to be discharged to:: Skilled nursing facility             Home Equipment: Grab bars - tub/shower;Grab bars - toilet      Prior Function Level of Independence: Independent          PT Goals (current goals can now be found in the care plan section) Acute Rehab PT Goals Patient Stated Goal: To get stronger Progress towards PT goals: Progressing toward goals    Frequency  7X/week    PT Plan      Co-evaluation             End of Session Equipment Utilized During Treatment: Gait belt Activity Tolerance: Patient tolerated treatment well Patient left: in bed;in CPM;with call bell/phone within reach  Time: [id number] PT Time Calculation (min): 26 min  Charges:  $Gait Training: 23-37 mins $Therapeutic Exercise: 8-22 mins $Therapeutic Activity: 8-22 mins                    G Codes:      BRASFIELD,Erica Buckley,SPTA 08/26/2013, 3:44 PM

## 2013-08-27 LAB — GLUCOSE, CAPILLARY
GLUCOSE-CAPILLARY: 119 mg/dL — AB (ref 70–99)
GLUCOSE-CAPILLARY: 136 mg/dL — AB (ref 70–99)
Glucose-Capillary: 116 mg/dL — ABNORMAL HIGH (ref 70–99)
Glucose-Capillary: 138 mg/dL — ABNORMAL HIGH (ref 70–99)

## 2013-08-27 LAB — CBC
HEMATOCRIT: 26.6 % — AB (ref 36.0–46.0)
Hemoglobin: 8.5 g/dL — ABNORMAL LOW (ref 12.0–15.0)
MCH: 24.4 pg — ABNORMAL LOW (ref 26.0–34.0)
MCHC: 32 g/dL (ref 30.0–36.0)
MCV: 76.2 fL — ABNORMAL LOW (ref 78.0–100.0)
PLATELETS: 170 10*3/uL (ref 150–400)
RBC: 3.49 MIL/uL — ABNORMAL LOW (ref 3.87–5.11)
RDW: 16.3 % — AB (ref 11.5–15.5)
WBC: 10.2 10*3/uL (ref 4.0–10.5)

## 2013-08-27 NOTE — Progress Notes (Signed)
Clinical Social Work Department CLINICAL SOCIAL WORK PLACEMENT NOTE 08/27/2013  Patient:  Erica Buckley,Erica Buckley  Account Number:  [id number] Admit date:  08/25/2013  Clinical Social Worker:  Derrell LollingRIS Hanny Elsberry, LCSW  Date/time:  08/26/2013 11:00 AM  Clinical Social Work is seeking post-discharge placement for this patient at the following level of care:   SKILLED NURSING   (*CSW will update this form in Epic as items are completed)   08/26/2013  [facility]/family provided with [facility] Department of Clinical Social Work's list of facilities offering this level of care within the geographic area requested by the patient (or if unable, by the patient's family).  08/26/2013  Patient/family informed of their freedom to choose among providers that offer the needed level of care, that participate in Medicare, Medicaid or managed care program needed by the patient, have an available bed and are willing to accept the patient.  08/26/2013  Patient/family informed of [facility]' ownership interest in [facility], as well as of the fact that they are under no obligation to receive care at this facility.  PASARR submitted to EDS on 08/26/2013 PASARR number received on 08/26/2013  FL2 transmitted to all facilities in geographic area requested by pt/family on  08/26/2013 FL2 transmitted to all facilities within larger geographic area on 08/26/2013  Patient informed that his/her managed care company has contracts with or will negotiate with  certain facilities, including the following:     Patient/family informed of bed offers received:  08/26/2013 [facility] Physician recommends and patient chooses bed at    [facility] be [facility]  08/28/2013 Patient to be transferred to facility by [facility] Patient and family notified of transfer on 08/27/2013 Name of family member notified:    The following physician request were entered in Epic:   Additional  Comments:   Howard PouchDoris Breion Novacek,LCSWA [phone]

## 2013-08-27 NOTE — Clinical Documentation Improvement (Signed)
  CBC results available in [facility].  HGB down 3.7 g/dL postoperatively Component     Latest Ref Rng 08/18/2013 08/26/2013 08/27/2013  WBC     4.0 - 10.5 K/uL 5.2 10.3 10.2  RBC     3.87 - 5.11 MIL/uL 4.89 3.74 (L) 3.49 (L)  Hemoglobin     12.0 - 15.0 g/dL 09.812.2 9.1 (L) 8.5 (L)  HCT     36.0 - 46.0 % 38.1 28.5 (L) 26.6 (L)  MCV     78.0 - 100.0 fL 77.9 (L) 76.2 (L) 76.2 (L)  [facility]     26.0 - 34.0 pg 24.9 (L) 24.3 (L) 24.4 (L)  [facility]     30.0 - 36.0 g/dL 11.932.0 14.731.9 82.932.0  RDW     11.5 - 15.5 % 16.5 (H) 16.0 (H) 16.3 (H)  Platelets     150 - 400 K/uL 205 176 170    Possible Clinical Conditions:   - Acute Blood Loss Anemia, including any associated treatment or assessment   - Other Condition   - Unable to Clinically Determine    Thank You,  Jerral Ralphathy R Benjy Kana ,RN BSN CCDS Certified Clinical Documentation Specialist:  [phone] [facility]- Health Information Management

## 2013-08-27 NOTE — Progress Notes (Signed)
Physical Therapy Treatment Patient Details Name: Erica CrankerCatherine L Buckley MRN: [id number] DOB: 18-Jul-1940 Today's Date: 08/27/2013    History of Present Illness Patient is a 73 yo female admitted 08/25/13 now s/p Lt TKA.  PMH:  anemia, HTN, DM, headaches.    PT Comments    Pt was not able to amb due to nausea. Pt did well with therex and progressively getting stronger. Pt is very motivated to do all she can to get better quickly. Pt planning to D/C to SNF ([facility]) tomorrow.  Follow Up Recommendations  SNF;Supervision/Assistance - 24 hour     Equipment Recommendations  Rolling walker with 5" wheels;3in1 (PT)    Recommendations for Other Services       Precautions / Restrictions Precautions Precautions: Knee Restrictions LLE Weight Bearing: Weight bearing as tolerated    Mobility  Bed Mobility               General bed mobility comments: pt in recliner before and after session  Transfers Overall transfer level: Needs assistance Equipment used: Rolling walker (2 wheeled) Transfers: Sit to/from Stand Sit to Stand: Min assist         General transfer comment: min assist to power up from sitting and get balance. pt has trouble shifting weight between both LEs to get balanced.   Ambulation/Gait             General Gait Details: Not able to amb due to nausea   Stairs            Wheelchair Mobility    Modified Rankin (Stroke Patients Only)       Balance                                    Cognition Arousal/Alertness: Awake/alert Behavior During Therapy: WFL for tasks assessed/performed Overall Cognitive Status: Within Functional Limits for tasks assessed                      Exercises Total Joint Exercises Ankle Circles/Pumps: AROM;Seated;Both;10 reps Quad Sets: AROM;Seated;Left;10 reps Heel Slides: AAROM;Seated;Left;5 reps Hip ABduction/ADduction: AAROM;Seated;Left;10 reps Long Arc Quad: AAROM;Seated;Left;10 reps     General Comments        Pertinent Vitals/Pain no apparent distress. Pt repositioned in recliner for comfort.     Home Living                      Prior Function            PT Goals (current goals can now be found in the care plan section) Progress towards PT goals: Progressing toward goals    Frequency  7X/week    PT Plan      Co-evaluation             End of Session Equipment Utilized During Treatment: Gait belt Activity Tolerance: Patient tolerated treatment well;Treatment limited secondary to medical complications (Comment) Patient left: in chair;with call bell/phone within reach     Time: 1053-1120 PT Time Calculation (min): 27 min  Charges:                       G Codes:      BRASFIELD,Erica Buckley,SPTA 08/27/2013, 12:12 PM

## 2013-08-27 NOTE — Progress Notes (Signed)
Seen and agreed 08/27/2013 [name], [name] PTA [phone] pager [phone] office    

## 2013-08-27 NOTE — Progress Notes (Signed)
Subjective: 2 Days Post-Op Procedure(s) (LRB): LEFT TOTAL KNEE ARTHROPLASTY (Left)  Activity level:  wbat Diet tolerance:  eating well Voiding:  ok Patient reports pain as mild and moderate.    Objective: Vital signs in last 24 hours: Temp:  [99 F (37.2 C)-100.6 F (38.1 C)] 99.6 F (37.6 C) (07/29 0646) Pulse Rate:  [74-94] 94 (07/29 0521) Resp:  [16] 16 (07/29 0521) BP: (130-134)/(49-65) 134/49 mmHg (07/29 0521) SpO2:  [94 %-99 %] 96 % (07/29 0521) Weight:  [105.688 kg (233 lb)] 105.688 kg (233 lb) (07/29 0221)  Labs:  Recent Labs  08/26/13 0440 08/27/13 0545  HGB 9.1* 8.5*    Recent Labs  08/26/13 0440 08/27/13 0545  WBC 10.3 10.2  RBC 3.74* 3.49*  HCT 28.5* 26.6*  PLT 176 170    Recent Labs  08/26/13 0440  NA 139  K 3.8  CL 104  CO2 24  BUN 13  CREATININE 0.86  GLUCOSE 126*  CALCIUM 8.4   No results found for this basename: LABPT, INR,  in the last 72 hours  Physical Exam:  Neurologically intact ABD soft Neurovascular intact Sensation intact distally Intact pulses distally Dorsiflexion/Plantar flexion intact Incision: dressing C/D/I No cellulitis present Compartment soft  Assessment/Plan:  2 Days Post-Op Procedure(s) (LRB): LEFT TOTAL KNEE ARTHROPLASTY (Left) Advance diet Up with therapy D/C IV fluids Plan for discharge tomorrow [facility]     Ericha Whittingham, Ginger OrganNDREW PAUL 08/27/2013, 7:55 AM

## 2013-08-28 DIAGNOSIS — D62 Acute posthemorrhagic anemia: Secondary | ICD-10-CM | POA: Diagnosis not present

## 2013-08-28 LAB — GLUCOSE, CAPILLARY
GLUCOSE-CAPILLARY: 128 mg/dL — AB (ref 70–99)
Glucose-Capillary: 137 mg/dL — ABNORMAL HIGH (ref 70–99)

## 2013-08-28 MED ORDER — ASPIRIN 325 MG PO TBEC: 325.0000 mg | DELAYED_RELEASE_TABLET | Freq: Two times a day (BID) | ORAL | Status: DC

## 2013-08-28 MED ORDER — METHOCARBAMOL 500 MG PO TABS: 500.0000 mg | ORAL_TABLET | Freq: Four times a day (QID) | ORAL | Status: DC | PRN

## 2013-08-28 MED ORDER — OXYCODONE HCL 5 MG PO TABS: 5.0000 mg | ORAL_TABLET | ORAL | Status: DC | PRN

## 2013-08-28 NOTE — Care Management Note (Signed)
CARE MANAGEMENT NOTE 08/28/2013  Patient:  Erica Buckley,Erica L   Account Number:  [id number]  Date Initiated:  08/28/2013  Documentation initiated by:  Vance PeperBRADY,Dashiell Franchino  Subjective/Objective Assessment:   73 yr old female s/p left total knee arthroplasty.     Action/Plan:   [facility] Socail worker is aware.   Anticipated DC Date:  08/28/2013   Anticipated DC Plan:  SKILLED NURSING FACILITY  In-house referral  Clinical Social Worker      DC Planning Services  CM consult      [facility]  NA   Choice offered to / List presented to:     DME arranged  NA        HH arranged  NA      Status of service:  Completed, signed off Medicare Important Message given?  YES (If response is "NO", the following Medicare IM given date fields will be blank) Date Medicare IM given:  08/28/2013 Medicare IM given by:  Vance PeperBRADY,Rochelle Nephew Date Additional Medicare IM given:   Additional Medicare IM given by:    Discharge Disposition:  SKILLED NURSING FACILITY  Per UR Regulation:  Reviewed for med. necessity/level of care/duration of stay  If discussed at Long Length of Stay Meetings, dates discussed:

## 2013-08-28 NOTE — Progress Notes (Signed)
Seen and agreed 08/28/2013 Sanya Kobrin Elizabeth PTA [phone] pager [phone] office    

## 2013-08-28 NOTE — Discharge Summary (Signed)
Patient ID: COLETTE DICAMILLO MRN: [id number] DOB/AGE: 1940-06-14 73 y.o.  Admit date: 08/25/2013 Discharge date: 08/28/2013  Admission Diagnoses:  Active Problems:   Total knee replacement status   Postoperative anemia due to acute blood loss   Discharge Diagnoses:  Same  Past Medical History  Diagnosis Date  . Anemia   . Ulcer   . Varicose veins   . PONV (postoperative nausea and vomiting)   . Hypertension   . Diabetes mellitus without complication     fasting 110s  . Headache(784.0)     Surgeries: Procedure(s): LEFT TOTAL KNEE ARTHROPLASTY on 08/25/2013   Consultants:    Discharged Condition: Improved  Hospital Course: DEBORAH LAZCANO is an 73 y.o. female who was admitted 08/25/2013 for operative treatment of<principal problem not specified>. Patient has severe unremitting pain that affects sleep, daily activities, and work/hobbies. After pre-op clearance the patient was taken to the operating room on 08/25/2013 and underwent  Procedure(s): LEFT TOTAL KNEE ARTHROPLASTY.    Patient was given perioperative antibiotics: Anti-infectives   Start     Dose/Rate Route Frequency Ordered Stop   08/25/13 1900  ceFAZolin (ANCEF) IVPB 1 g/50 mL premix     1 g 100 mL/hr over 30 Minutes Intravenous Every 6 hours 08/25/13 1718 08/26/13 0042   08/25/13 1244  cefUROXime (ZINACEF) injection  Status:  Discontinued       As needed 08/25/13 1245 08/25/13 1512   08/25/13 0600  ceFAZolin (ANCEF) IVPB 2 g/50 mL premix     2 g 100 mL/hr over 30 Minutes Intravenous On call to O.R. 08/24/13 1323 08/25/13 1305       Patient was given sequential compression devices, early ambulation, and chemoprophylaxis to prevent DVT.  Patient benefited maximally from hospital stay and there were no complications.    Recent vital signs: Patient Vitals for the past 24 hrs:  BP Temp Temp src Pulse Resp SpO2  08/28/13 0558 138/70 mmHg 99.6 F (37.6 C) Oral 103 18 97 %  08/27/13 2117 162/62 mmHg  98.8 F (37.1 C) Oral 87 18 94 %  08/27/13 1416 109/47 mmHg 97.7 F (36.5 C) Oral 67 - 93 %  08/27/13 1038 117/51 mmHg - - 69 - -     Recent laboratory studies:  Recent Labs  08/26/13 0440 08/27/13 0545  WBC 10.3 10.2  HGB 9.1* 8.5*  HCT 28.5* 26.6*  PLT 176 170  NA 139  --   K 3.8  --   CL 104  --   CO2 24  --   BUN 13  --   CREATININE 0.86  --   GLUCOSE 126*  --   CALCIUM 8.4  --      Discharge Medications:     Medication List    STOP taking these medications       diclofenac sodium 1 % Gel  Commonly known as:  VOLTAREN     ibuprofen 800 MG tablet  Commonly known as:  ADVIL,MOTRIN      TAKE these medications       aspirin 325 MG EC tablet  Take 1 tablet (325 mg total) by mouth 2 (two) times daily.     glimepiride 1 MG tablet  Commonly known as:  AMARYL  Take 1 mg by mouth daily with breakfast.     losartan-hydrochlorothiazide 50-12.5 MG per tablet  Commonly known as:  HYZAAR  Take 1 tablet by mouth daily.     methocarbamol 500 MG tablet  Commonly known as:  ROBAXIN  Take 1 tablet (500 mg total) by mouth every 6 (six) hours as needed for muscle spasms.     metoprolol succinate 50 MG 24 hr tablet  Commonly known as:  TOPROL-XL  Take 50 mg by mouth daily. Take with or immediately following a meal.     oxyCODONE 5 MG immediate release tablet  Commonly known as:  Oxy IR/ROXICODONE  Take 1-2 tablets (5-10 mg total) by mouth every 3 (three) hours as needed for breakthrough pain.        Diagnostic Studies: Dg Chest 2 View  08/18/2013   CLINICAL DATA:  Preop  EXAM: CHEST  2 VIEW  COMPARISON:  01/13/2010  FINDINGS: The heart size and mediastinal contours are within normal limits. Both lungs are clear. The visualized skeletal structures are unremarkable.  IMPRESSION: No active cardiopulmonary disease.   Electronically Signed   By: Maryclare BeanArt  Hoss M.D.   On: 08/18/2013 10:35    Disposition:       Discharge Instructions   Call MD / Call 911    Complete by:   As directed   If you experience chest pain or shortness of breath, CALL 911 and be transported to the hospital emergency room.  If you develope a fever above 101 F, pus (white drainage) or increased drainage or redness at the wound, or calf pain, call your surgeon's office.     Constipation Prevention    Complete by:  As directed   Drink plenty of fluids.  Prune juice may be helpful.  You may use a stool softener, such as Colace (over the counter) 100 mg twice a day.  Use MiraLax (over the counter) for constipation as needed.     Diet - low sodium heart healthy    Complete by:  As directed      Increase activity slowly as tolerated    Complete by:  As directed               Signed: Christiana Gurevich, Ginger OrganANDREW PAUL 08/28/2013, 7:53 AM

## 2013-08-28 NOTE — Progress Notes (Signed)
Pt ready for discharge. PTAR and packet arranged.  Gerda Dissoris Besl,LCSWA [phone]

## 2013-08-28 NOTE — Progress Notes (Signed)
Physical Therapy Treatment Patient Details Name: Erica CrankerCatherine L Hymes MRN: [id number] DOB: 08-25-1940 Today's Date: 08/28/2013    History of Present Illness Patient is a 73 yo female admitted 08/25/13 now s/p Lt TKA.  PMH:  anemia, HTN, DM, headaches.    PT Comments    Pt feeling much better today with no nausea. Pt motivated to amb, and was able to amb further this session, but still limited by fatigue. Pt doing well with strengthening exercises, gets fatigued quickly. Pt planning to SNF [facility] today.   Follow Up Recommendations  SNF;Supervision/Assistance - 24 hour     Equipment Recommendations  Rolling walker with 5" wheels;3in1 (PT)    Recommendations for Other Services       Precautions / Restrictions Precautions Precautions: Knee Restrictions LLE Weight Bearing: Weight bearing as tolerated    Mobility  Bed Mobility Overal bed mobility: Needs Assistance Bed Mobility: Supine to Sit     Supine to sit: Min assist     General bed mobility comments: Pt used trapeze bar to pull UE to sitting and used my arm for to complete sitting. needed assist getting LLE to EOB.   Transfers Overall transfer level: Needs assistance Equipment used: Rolling walker (2 wheeled) Transfers: Sit to/from Stand Sit to Stand: Min assist         General transfer comment: min assist to power up from sitting from elevated bed and to get balance.   Ambulation/Gait Ambulation/Gait assistance: Min guard Ambulation Distance (Feet): 50 Feet Assistive device: Rolling walker (2 wheeled) Gait Pattern/deviations: Step-to pattern;Decreased stride length Gait velocity: decreased Gait velocity interpretation: Below normal speed for age/gender General Gait Details: verbal cues needed for sequencing with RW. Pt needed v/c to keep walker close during amb    Stairs            Wheelchair Mobility    Modified Rankin (Stroke Patients Only)       Balance                                     Cognition Arousal/Alertness: Awake/alert Behavior During Therapy: WFL for tasks assessed/performed Overall Cognitive Status: Within Functional Limits for tasks assessed                      Exercises Total Joint Exercises Ankle Circles/Pumps: AROM;Seated;Both;10 reps Quad Sets: AROM;Seated;Left;10 reps Heel Slides: AAROM;Seated;Left;5 reps Hip ABduction/ADduction: AAROM;Seated;Left;5 reps Straight Leg Raises: Seated;10 reps;Left;AAROM Long Arc Quad: AAROM;Seated;Left;5 reps    General Comments        Pertinent Vitals/Pain 4/10. Pt repositioned in recliner for comfort with foot propped to extend L knee.     Home Living                      Prior Function            PT Goals (current goals can now be found in the care plan section) Progress towards PT goals: Progressing toward goals    Frequency  7X/week    PT Plan      Co-evaluation             End of Session Equipment Utilized During Treatment: Gait belt Activity Tolerance: Patient limited by fatigue;Patient tolerated treatment well Patient left: in chair;with call bell/phone within reach     Time: 0828-0914 PT Time Calculation (min): 46 min  Charges:  G Codes:      BRASFIELD,Callaghan Laverdure,SPTA 08/28/2013, 9:17 AM

## 2013-08-29 ENCOUNTER — Encounter: Payer: Self-pay | Admitting: Adult Health

## 2013-08-29 ENCOUNTER — Non-Acute Institutional Stay (SKILLED_NURSING_FACILITY): Payer: Medicare PPO | Admitting: Adult Health

## 2013-08-29 DIAGNOSIS — I10 Essential (primary) hypertension: Secondary | ICD-10-CM | POA: Insufficient documentation

## 2013-08-29 DIAGNOSIS — IMO0001 Reserved for inherently not codable concepts without codable children: Secondary | ICD-10-CM

## 2013-08-29 DIAGNOSIS — D62 Acute posthemorrhagic anemia: Secondary | ICD-10-CM

## 2013-08-29 DIAGNOSIS — K5900 Constipation, unspecified: Secondary | ICD-10-CM | POA: Insufficient documentation

## 2013-08-29 DIAGNOSIS — E1165 Type 2 diabetes mellitus with hyperglycemia: Secondary | ICD-10-CM

## 2013-08-29 DIAGNOSIS — M1712 Unilateral primary osteoarthritis, left knee: Secondary | ICD-10-CM

## 2013-08-29 DIAGNOSIS — M1710 Unilateral primary osteoarthritis, unspecified knee: Secondary | ICD-10-CM

## 2013-08-29 NOTE — Progress Notes (Signed)
Patient ID: Erica Buckley, female   DOB: [birth year], 73 y.o.   MRN: [id number]              PROGRESS NOTE  DATE: 08/29/2013  FACILITY: Nursing Home Location: [facility]  LEVEL OF CARE: SNF (31)  Acute Visit  CHIEF COMPLAINT:  Follow-up Hospitalization  HISTORY OF PRESENT ILLNESS: This is a 73 year old female who has been admitted to [facility] on 08/28/13 from [facility] with Osteoarthritis S/P Left total knee arthroplasty. She has been admitted for a short-term rehabilitation.  REASSESSMENT OF ONGOING PROBLEM(S):  HTN: Pt 's HTN remains stable.  Denies CP, sob, DOE, pedal edema, headaches, dizziness or visual disturbances.  No complications from the medications currently being used.  Last BP : 102/48  ANEMIA: The anemia has been stable. The patient denies fatigue, melena or hematochezia. No complications from the medications currently being used.  7/15 hgb 8.5  DM:pt's DM remains stable.  Pt denies polyuria, polydipsia, polyphagia, changes in vision or hypoglycemic episodes.  No complications noted from the medication presently being used.  Last hemoglobin A1c is not available  PAST MEDICAL HISTORY : Reviewed.  No changes/see problem list  CURRENT MEDICATIONS: Reviewed per MAR/see medication list  REVIEW OF SYSTEMS:  GENERAL: no change in appetite, no fatigue, no weight changes, no fever, chills or weakness RESPIRATORY: no cough, SOB, DOE, wheezing, hemoptysis CARDIAC: no chest pain, edema or palpitations GI: no abdominal pain, diarrhea, heart burn, nausea or vomiting, +constipation  PHYSICAL EXAMINATION  GENERAL: no acute distress, obese EYES: conjunctivae normal, sclerae normal, normal eye lids NECK: supple, trachea midline, no neck masses, no thyroid tenderness, no thyromegaly LYMPHATICS: no LAN in the neck, no supraclavicular LAN RESPIRATORY: breathing is even & unlabored, BS CTAB CARDIAC: RRR, no murmur,no extra heart sounds, no  edema GI: abdomen soft, normal BS, no masses, no tenderness, no hepatomegaly, no splenomegaly EXTREMITIES:  Able to move all 4 extremities PSYCHIATRIC: the patient is alert & oriented to person, affect & behavior appropriate  LABS/RADIOLOGY: Labs reviewed: Basic Metabolic Panel:  Recent Labs  78/29/56 1023 08/26/13 0440  NA 144 139  K 4.3 3.8  CL 103 104  CO2 29 24  GLUCOSE 108* 126*  BUN 15 13  CREATININE 0.78 0.86  CALCIUM 9.5 8.4   Liver Function Tests:  Recent Labs  08/18/13 1023  AST 19  ALT 21  ALKPHOS 97  BILITOT 0.3  PROT 7.8  ALBUMIN 4.0   CBC:  Recent Labs  08/18/13 1023 08/26/13 0440 08/27/13 0545  WBC 5.2 10.3 10.2  NEUTROABS 1.7  --   --   HGB 12.2 9.1* 8.5*  HCT 38.1 28.5* 26.6*  MCV 77.9* 76.2* 76.2*  PLT 205 176 170   BNP: No components found with this basename: POCBNP,  CBG:  Recent Labs  08/27/13 2206 08/28/13 0642 08/28/13 1120  GLUCAP 136* 128* 137*    EXAM: CHEST  2 VIEW   COMPARISON:  01/13/2010   FINDINGS: The heart size and mediastinal contours are within normal limits. Both lungs are clear. The visualized skeletal structures are unremarkable.   IMPRESSION: No active cardiopulmonary disease.   ASSESSMENT/PLAN:  Osteoarthritis status post left total knee arthroplasty - for rehabilitation  Hypertension - well controlled; continue Hyzaar and Toprol XL; monitor BP Diabetes mellitus - continue Amaryl Anemia, acute blood loss - check cbc Constipation -  Start Colace100 mg PO BID and Miralax 17 gm + 6 - 8 oz liquid PO Q  D   CPT CODE: 8119199309  Ella BodoMonina Vargas - NP [facility] [phone]

## 2013-09-02 ENCOUNTER — Non-Acute Institutional Stay (SKILLED_NURSING_FACILITY): Payer: Medicare PPO | Admitting: Internal Medicine

## 2013-09-02 DIAGNOSIS — M1712 Unilateral primary osteoarthritis, left knee: Secondary | ICD-10-CM

## 2013-09-02 DIAGNOSIS — E119 Type 2 diabetes mellitus without complications: Secondary | ICD-10-CM

## 2013-09-02 DIAGNOSIS — M1710 Unilateral primary osteoarthritis, unspecified knee: Secondary | ICD-10-CM

## 2013-09-02 DIAGNOSIS — D62 Acute posthemorrhagic anemia: Secondary | ICD-10-CM

## 2013-09-02 DIAGNOSIS — I10 Essential (primary) hypertension: Secondary | ICD-10-CM

## 2013-09-02 NOTE — Progress Notes (Signed)
HISTORY & PHYSICAL  DATE: 09/02/2013   FACILITY: [facility]  LEVEL OF CARE: SNF (31)  ALLERGIES:  Allergies  Allergen Reactions  . Codeine Shortness Of Breath    CHIEF COMPLAINT:  Manage left knee osteoarthritis, acute blood loss anemia and diabetes mellitus  HISTORY OF PRESENT ILLNESS: Patient is a 73 year old African American female.  KNEE OSTEOARTHRITIS: Patient had a history of pain and functional disability in the knee due to end-stage osteoarthritis and has failed nonsurgical conservative treatments. Patient had worsening of pain with activity and weight bearing, pain that interfered with activities of daily living & pain with passive range of motion. Therefore patient underwent total knee arthroplasty and tolerated the procedure well. Patient is admitted to this facility for sort short-term rehabilitation. Patient denies knee pain.  ANEMIA: The anemia has been stable. The patient denies fatigue, melena or hematochezia. No complications from the medications currently being used. Postoperative the patient suffered an acute blood loss. She did not require transfusion. Last hemoglobins are 8.5 and 9.1.  DM:pt's DM remains stable.  Pt denies polyuria, polydipsia, polyphagia, changes in vision or hypoglycemic episodes.  No complications noted from the medication presently being used.  Last hemoglobin A1c is: not available.  PAST MEDICAL HISTORY :  Past Medical History  Diagnosis Date  . Anemia   . Ulcer   . Varicose veins   . PONV (postoperative nausea and vomiting)   . Hypertension   . Diabetes mellitus without complication     fasting 110s  . Headache(784.0)     PAST SURGICAL HISTORY: Past Surgical History  Procedure Laterality Date  . Abdominal hysterectomy    . Vein ligation and stripping  1978  . Endovenous ablation saphenous vein w/ laser  02-29-2012    right small saphenous vein  by Gretta Beganodd Early MD  . Endovenous ablation saphenous  vein w/ laser Left 04-11-2012    left greater saphenous by Gretta Beganodd Early MD  . Knee arthroscopy Left   . Total knee arthroplasty Left 08/25/2013    Procedure: LEFT TOTAL KNEE ARTHROPLASTY;  Surgeon: Harvie JuniorJohn L Graves, MD;  Location: [facility] OR;  Service: Orthopedics;  Laterality: Left;    SOCIAL HISTORY:  reports that she has never smoked. She has never used smokeless tobacco. She reports that she drinks about .6 ounces of alcohol per week. She reports that she does not use illicit drugs.  FAMILY HISTORY:  Family History  Problem Relation Age of Onset  . Cancer Father   . Diabetes Father     CURRENT MEDICATIONS: Reviewed per MAR/see medication list  REVIEW OF SYSTEMS:  See HPI otherwise 14 point ROS is negative.  PHYSICAL EXAMINATION  VS:  See VS section  GENERAL: no acute distress, moderately obese body habitus EYES: conjunctivae normal, sclerae normal, normal eye lids MOUTH/THROAT: lips without lesions,no lesions in the mouth,tongue is without lesions,uvula elevates in midline NECK: supple, trachea midline, no neck masses, no thyroid tenderness, no thyromegaly LYMPHATICS: no LAN in the neck, no supraclavicular LAN RESPIRATORY: breathing is even & unlabored, BS CTAB CARDIAC: RRR, no murmur,no extra heart sounds, left lower extremity +2 edema GI:  ABDOMEN: abdomen soft, normal BS, no masses, no tenderness  LIVER/SPLEEN: no hepatomegaly, no splenomegaly MUSCULOSKELETAL: HEAD: normal to inspection  EXTREMITIES: LEFT UPPER EXTREMITY: full range of motion, normal strength & tone RIGHT UPPER EXTREMITY:  full range of motion, normal strength & tone LEFT LOWER EXTREMITY:  range of motion not tested due  to surgery, normal strength & tone RIGHT LOWER EXTREMITY:  Moderate range of motion, normal strength & tone PSYCHIATRIC: the patient is alert & oriented to person, affect & behavior appropriate  LABS/RADIOLOGY:  Labs reviewed: Basic Metabolic Panel:  Recent Labs  16/10/96 1023  08/26/13 0440  NA 144 139  K 4.3 3.8  CL 103 104  CO2 29 24  GLUCOSE 108* 126*  BUN 15 13  CREATININE 0.78 0.86  CALCIUM 9.5 8.4   Liver Function Tests:  Recent Labs  08/18/13 1023  AST 19  ALT 21  ALKPHOS 97  BILITOT 0.3  PROT 7.8  ALBUMIN 4.0   CBC:  Recent Labs  08/18/13 1023 08/26/13 0440 08/27/13 0545  WBC 5.2 10.3 10.2  NEUTROABS 1.7  --   --   HGB 12.2 9.1* 8.5*  HCT 38.1 28.5* 26.6*  MCV 77.9* 76.2* 76.2*  PLT 205 176 170   CBG:  Recent Labs  08/27/13 2206 08/28/13 0642 08/28/13 1120  GLUCAP 136* 128* 137*    CHEST  2 VIEW   COMPARISON:  01/13/2010   FINDINGS: The heart size and mediastinal contours are within normal limits. Both lungs are clear. The visualized skeletal structures are unremarkable.   IMPRESSION: No active cardiopulmonary disease.    ASSESSMENT/PLAN:  Left knee osteoarthritis-status post total knee arthroplasty. Continue rehabilitation. Acute blood loss anemia-check hemoglobin and iron studies due to microcytosis. Diabetes mellitus-continue glimepiride. Hypertension-well-controlled Constipation-Colace and MiraLax were started. Check CBC  I have reviewed patient's medical records received at admission/from hospitalization.  CPT CODE: 04540  Angela Cox, MD [facility] [phone]

## 2013-09-08 MED FILL — Tranexamic Acid Inj 100 MG/ML: INTRAVENOUS | Qty: 10 | Status: AC

## 2013-09-09 ENCOUNTER — Non-Acute Institutional Stay (SKILLED_NURSING_FACILITY): Payer: Medicare PPO | Admitting: Internal Medicine

## 2013-09-09 DIAGNOSIS — M1710 Unilateral primary osteoarthritis, unspecified knee: Secondary | ICD-10-CM

## 2013-09-09 DIAGNOSIS — E119 Type 2 diabetes mellitus without complications: Secondary | ICD-10-CM

## 2013-09-09 DIAGNOSIS — D62 Acute posthemorrhagic anemia: Secondary | ICD-10-CM

## 2013-09-09 DIAGNOSIS — M1712 Unilateral primary osteoarthritis, left knee: Secondary | ICD-10-CM

## 2013-09-09 DIAGNOSIS — I10 Essential (primary) hypertension: Secondary | ICD-10-CM

## 2013-09-09 NOTE — Progress Notes (Signed)
        PROGRESS NOTE  DATE: 09/09/2013   FACILITY: [facility]  LEVEL OF CARE: SNF (31)  Discharge Visit  CHIEF COMPLAINT:  Manage left knee osteoarthritis and diabetes mellitus  HISTORY OF PRESENT ILLNESS: I was requested by the social worker to perform face-to-face evaluation for discharge:  Patient was admitted to this facility for short-term rehabilitation after the patient's recent hospitalization.  Patient has completed SNF rehabilitation and therapy has cleared the patient for discharge on 09-13-13.  Reassessment of ongoing problem(s):  KNEE OSTEOARTHRITIS: Patient had a history of pain and functional disability in the knee due to end-stage osteoarthritis and has failed nonsurgical conservative treatments. Patient had worsening of pain with activity and weight bearing, pain that interfered with activities of daily living & pain with passive range of motion. Therefore patient underwent total knee arthroplasty and tolerated the procedure well. Patient was admitted to this facility for sort short-term rehabilitation. Patient denies knee pain.  DM:pt's DM remains stable.  Pt denies polyuria, polydipsia, polyphagia, changes in vision or hypoglycemic episodes.  No complications noted from the medication presently being used.  Last hemoglobin A1c is: Not available.  PAST MEDICAL HISTORY : Reviewed.  No changes/see problem list  CURRENT MEDICATIONS: Reviewed per MAR/see medication list  REVIEW OF SYSTEMS:  GENERAL: no change in appetite, no fatigue, no weight changes, no fever, chills or weakness RESPIRATORY: no cough, SOB, DOE, wheezing, hemoptysis CARDIAC: no chest pain, edema or palpitations GI: no abdominal pain, diarrhea, constipation, heart burn, nausea or vomiting  PHYSICAL EXAMINATION  VS:  See VS section  GENERAL: no acute distress,  Moderately obese body habitus EYES: conjunctivae normal, sclerae normal, normal eye lids NECK: supple, trachea  midline, no neck masses, no thyroid tenderness, no thyromegaly LYMPHATICS: no LAN in the neck, no supraclavicular LAN RESPIRATORY: breathing is even & unlabored, BS CTAB CARDIAC: RRR, no murmur,no extra heart sounds, +1 bilateral lower extremity edema GI: abdomen soft, normal BS, no masses, no tenderness, no hepatomegaly, no splenomegaly PSYCHIATRIC: the patient is alert & oriented to person, affect & behavior appropriate  LABS/RADIOLOGY: None in the chart  ASSESSMENT/PLAN:  Left knee osteoarthritis-status post total knee arthroplasty. Diabetes mellitus-continue current medications Acute blood loss anemia-CBC pending Hypertension-well-controlled Constipation-well-controlled  I have filled out patient's discharge paperwork and written prescriptions.  Patient will receive home health PT, OT. DME provided:rolling walker  Total discharge time: Greater than 30 minutes Discharge time involved coordination of the discharge process with social worker, nursing staff and therapy department. Medical justification for home health services/DME verified.  CPT CODE: [id number]  Erica CoxGayani Y Qiara Minetti, MD [facility] [phone]

## 2013-09-11 ENCOUNTER — Encounter: Payer: Self-pay | Admitting: Adult Health

## 2013-09-12 NOTE — Progress Notes (Signed)
This encounter was created in error - please disregard.

## 2013-10-24 ENCOUNTER — Other Ambulatory Visit (HOSPITAL_COMMUNITY): Payer: Self-pay | Admitting: Family Medicine

## 2013-10-24 DIAGNOSIS — Z1231 Encounter for screening mammogram for malignant neoplasm of breast: Secondary | ICD-10-CM

## 2013-11-26 ENCOUNTER — Ambulatory Visit (HOSPITAL_COMMUNITY): Payer: Medicare PPO

## 2013-12-05 ENCOUNTER — Ambulatory Visit (HOSPITAL_COMMUNITY): Payer: Medicare PPO | Attending: Family Medicine

## 2013-12-12 ENCOUNTER — Ambulatory Visit (HOSPITAL_COMMUNITY): Payer: Medicare PPO

## 2013-12-23 ENCOUNTER — Ambulatory Visit (HOSPITAL_COMMUNITY)
Admission: RE | Admit: 2013-12-23 | Discharge: 2013-12-23 | Disposition: A | Discharge status: Home / Self Care | Payer: Medicare PPO | Source: Ambulatory Visit | Attending: Family Medicine | Admitting: Family Medicine

## 2013-12-23 DIAGNOSIS — Z1231 Encounter for screening mammogram for malignant neoplasm of breast: Secondary | ICD-10-CM | POA: Insufficient documentation

## 2014-03-03 DIAGNOSIS — Z96652 Presence of left artificial knee joint: Secondary | ICD-10-CM | POA: Diagnosis not present

## 2014-03-31 DIAGNOSIS — R42 Dizziness and giddiness: Secondary | ICD-10-CM | POA: Diagnosis not present

## 2014-03-31 DIAGNOSIS — I10 Essential (primary) hypertension: Secondary | ICD-10-CM | POA: Diagnosis not present

## 2014-03-31 DIAGNOSIS — E119 Type 2 diabetes mellitus without complications: Secondary | ICD-10-CM | POA: Diagnosis not present

## 2014-03-31 DIAGNOSIS — Z23 Encounter for immunization: Secondary | ICD-10-CM | POA: Diagnosis not present

## 2014-06-30 DIAGNOSIS — N898 Other specified noninflammatory disorders of vagina: Secondary | ICD-10-CM | POA: Diagnosis not present

## 2014-06-30 DIAGNOSIS — R300 Dysuria: Secondary | ICD-10-CM | POA: Diagnosis not present

## 2014-08-11 DIAGNOSIS — M25562 Pain in left knee: Secondary | ICD-10-CM | POA: Diagnosis not present

## 2014-12-07 ENCOUNTER — Other Ambulatory Visit: Payer: Self-pay

## 2014-12-07 DIAGNOSIS — Z1231 Encounter for screening mammogram for malignant neoplasm of breast: Secondary | ICD-10-CM

## 2015-01-05 ENCOUNTER — Ambulatory Visit: Payer: Medicare PPO

## 2015-02-19 DIAGNOSIS — J069 Acute upper respiratory infection, unspecified: Secondary | ICD-10-CM | POA: Diagnosis not present

## 2015-04-06 DIAGNOSIS — T8484XA Pain due to internal orthopedic prosthetic devices, implants and grafts, initial encounter: Secondary | ICD-10-CM | POA: Diagnosis not present

## 2015-04-06 DIAGNOSIS — M25562 Pain in left knee: Secondary | ICD-10-CM | POA: Diagnosis not present

## 2015-04-09 ENCOUNTER — Ambulatory Visit
Admission: RE | Admit: 2015-04-09 | Discharge: 2015-04-09 | Disposition: A | Discharge status: Home / Self Care | Payer: Commercial Managed Care - HMO | Source: Ambulatory Visit

## 2015-04-09 DIAGNOSIS — Z1231 Encounter for screening mammogram for malignant neoplasm of breast: Secondary | ICD-10-CM | POA: Diagnosis not present

## 2015-05-05 DIAGNOSIS — N182 Chronic kidney disease, stage 2 (mild): Secondary | ICD-10-CM | POA: Diagnosis not present

## 2015-05-05 DIAGNOSIS — I129 Hypertensive chronic kidney disease with stage 1 through stage 4 chronic kidney disease, or unspecified chronic kidney disease: Secondary | ICD-10-CM | POA: Diagnosis not present

## 2015-05-05 DIAGNOSIS — E1121 Type 2 diabetes mellitus with diabetic nephropathy: Secondary | ICD-10-CM | POA: Diagnosis not present

## 2015-06-09 DIAGNOSIS — H5210 Myopia, unspecified eye: Secondary | ICD-10-CM | POA: Diagnosis not present

## 2015-06-09 DIAGNOSIS — H5213 Myopia, bilateral: Secondary | ICD-10-CM | POA: Diagnosis not present

## 2015-07-08 DIAGNOSIS — R42 Dizziness and giddiness: Secondary | ICD-10-CM | POA: Diagnosis not present

## 2015-08-19 DIAGNOSIS — N289 Disorder of kidney and ureter, unspecified: Secondary | ICD-10-CM | POA: Diagnosis not present

## 2015-08-31 DIAGNOSIS — R300 Dysuria: Secondary | ICD-10-CM | POA: Diagnosis not present

## 2015-08-31 DIAGNOSIS — E1121 Type 2 diabetes mellitus with diabetic nephropathy: Secondary | ICD-10-CM | POA: Diagnosis not present

## 2015-08-31 DIAGNOSIS — N390 Urinary tract infection, site not specified: Secondary | ICD-10-CM | POA: Diagnosis not present

## 2016-01-05 DIAGNOSIS — N182 Chronic kidney disease, stage 2 (mild): Secondary | ICD-10-CM | POA: Diagnosis not present

## 2016-01-05 DIAGNOSIS — Z7984 Long term (current) use of oral hypoglycemic drugs: Secondary | ICD-10-CM | POA: Diagnosis not present

## 2016-01-05 DIAGNOSIS — Z0000 Encounter for general adult medical examination without abnormal findings: Secondary | ICD-10-CM | POA: Diagnosis not present

## 2016-01-05 DIAGNOSIS — Z1389 Encounter for screening for other disorder: Secondary | ICD-10-CM | POA: Diagnosis not present

## 2016-01-05 DIAGNOSIS — Z23 Encounter for immunization: Secondary | ICD-10-CM | POA: Diagnosis not present

## 2016-01-05 DIAGNOSIS — E1121 Type 2 diabetes mellitus with diabetic nephropathy: Secondary | ICD-10-CM | POA: Diagnosis not present

## 2016-01-05 DIAGNOSIS — I129 Hypertensive chronic kidney disease with stage 1 through stage 4 chronic kidney disease, or unspecified chronic kidney disease: Secondary | ICD-10-CM | POA: Diagnosis not present

## 2016-01-05 DIAGNOSIS — Z1211 Encounter for screening for malignant neoplasm of colon: Secondary | ICD-10-CM | POA: Diagnosis not present

## 2016-01-05 DIAGNOSIS — Z13820 Encounter for screening for osteoporosis: Secondary | ICD-10-CM | POA: Diagnosis not present

## 2016-01-13 DIAGNOSIS — Z96652 Presence of left artificial knee joint: Secondary | ICD-10-CM | POA: Diagnosis not present

## 2016-01-13 DIAGNOSIS — T8484XD Pain due to internal orthopedic prosthetic devices, implants and grafts, subsequent encounter: Secondary | ICD-10-CM | POA: Diagnosis not present

## 2016-01-13 DIAGNOSIS — M25562 Pain in left knee: Secondary | ICD-10-CM | POA: Diagnosis not present

## 2016-02-23 DIAGNOSIS — Z800 Family history of malignant neoplasm of digestive organs: Secondary | ICD-10-CM | POA: Diagnosis not present

## 2016-04-05 DIAGNOSIS — K640 First degree hemorrhoids: Secondary | ICD-10-CM | POA: Diagnosis not present

## 2016-04-05 DIAGNOSIS — Z1211 Encounter for screening for malignant neoplasm of colon: Secondary | ICD-10-CM | POA: Diagnosis not present

## 2016-04-05 DIAGNOSIS — K5730 Diverticulosis of large intestine without perforation or abscess without bleeding: Secondary | ICD-10-CM | POA: Diagnosis not present

## 2016-04-05 DIAGNOSIS — Z800 Family history of malignant neoplasm of digestive organs: Secondary | ICD-10-CM | POA: Diagnosis not present

## 2016-04-26 DIAGNOSIS — Z13820 Encounter for screening for osteoporosis: Secondary | ICD-10-CM | POA: Diagnosis not present

## 2016-04-26 DIAGNOSIS — Z780 Asymptomatic menopausal state: Secondary | ICD-10-CM | POA: Diagnosis not present

## 2016-04-27 DIAGNOSIS — R42 Dizziness and giddiness: Secondary | ICD-10-CM | POA: Diagnosis not present

## 2016-04-27 DIAGNOSIS — I129 Hypertensive chronic kidney disease with stage 1 through stage 4 chronic kidney disease, or unspecified chronic kidney disease: Secondary | ICD-10-CM | POA: Diagnosis not present

## 2016-04-27 DIAGNOSIS — E1121 Type 2 diabetes mellitus with diabetic nephropathy: Secondary | ICD-10-CM | POA: Diagnosis not present

## 2016-04-27 DIAGNOSIS — Z7984 Long term (current) use of oral hypoglycemic drugs: Secondary | ICD-10-CM | POA: Diagnosis not present

## 2016-04-27 DIAGNOSIS — N182 Chronic kidney disease, stage 2 (mild): Secondary | ICD-10-CM | POA: Diagnosis not present

## 2016-05-22 DIAGNOSIS — M25562 Pain in left knee: Secondary | ICD-10-CM | POA: Diagnosis not present

## 2016-05-22 DIAGNOSIS — T8484XD Pain due to internal orthopedic prosthetic devices, implants and grafts, subsequent encounter: Secondary | ICD-10-CM | POA: Diagnosis not present

## 2016-08-07 DIAGNOSIS — H6982 Other specified disorders of Eustachian tube, left ear: Secondary | ICD-10-CM | POA: Diagnosis not present

## 2016-09-13 DIAGNOSIS — I129 Hypertensive chronic kidney disease with stage 1 through stage 4 chronic kidney disease, or unspecified chronic kidney disease: Secondary | ICD-10-CM | POA: Diagnosis not present

## 2016-09-13 DIAGNOSIS — N182 Chronic kidney disease, stage 2 (mild): Secondary | ICD-10-CM | POA: Diagnosis not present

## 2016-09-13 DIAGNOSIS — E1121 Type 2 diabetes mellitus with diabetic nephropathy: Secondary | ICD-10-CM | POA: Diagnosis not present

## 2016-11-29 ENCOUNTER — Ambulatory Visit
Admission: RE | Admit: 2016-11-29 | Discharge: 2016-11-29 | Disposition: A | Discharge status: Home / Self Care | Payer: Medicare HMO | Source: Ambulatory Visit | Attending: Family Medicine | Admitting: Family Medicine

## 2016-11-29 ENCOUNTER — Other Ambulatory Visit: Payer: Self-pay | Admitting: Family Medicine

## 2016-11-29 DIAGNOSIS — Z23 Encounter for immunization: Secondary | ICD-10-CM | POA: Diagnosis not present

## 2016-11-29 DIAGNOSIS — Z1231 Encounter for screening mammogram for malignant neoplasm of breast: Secondary | ICD-10-CM

## 2016-12-05 DIAGNOSIS — J069 Acute upper respiratory infection, unspecified: Secondary | ICD-10-CM | POA: Diagnosis not present

## 2017-01-17 DIAGNOSIS — H524 Presbyopia: Secondary | ICD-10-CM | POA: Diagnosis not present

## 2017-01-27 DIAGNOSIS — H52209 Unspecified astigmatism, unspecified eye: Secondary | ICD-10-CM | POA: Diagnosis not present

## 2017-01-27 DIAGNOSIS — H524 Presbyopia: Secondary | ICD-10-CM | POA: Diagnosis not present

## 2017-01-27 DIAGNOSIS — H5203 Hypermetropia, bilateral: Secondary | ICD-10-CM | POA: Diagnosis not present

## 2017-02-06 DIAGNOSIS — M7052 Other bursitis of knee, left knee: Secondary | ICD-10-CM | POA: Diagnosis not present

## 2017-02-13 ENCOUNTER — Encounter (HOSPITAL_COMMUNITY): Payer: Self-pay

## 2017-02-13 ENCOUNTER — Emergency Department (HOSPITAL_COMMUNITY)
Admission: EM | Admit: 2017-02-13 | Discharge: 2017-02-13 | Disposition: A | Discharge status: Home / Self Care | Payer: Medicare HMO | Attending: Emergency Medicine | Admitting: Emergency Medicine

## 2017-02-13 ENCOUNTER — Emergency Department (HOSPITAL_COMMUNITY): Payer: Medicare HMO

## 2017-02-13 DIAGNOSIS — E119 Type 2 diabetes mellitus without complications: Secondary | ICD-10-CM | POA: Diagnosis not present

## 2017-02-13 DIAGNOSIS — Z79899 Other long term (current) drug therapy: Secondary | ICD-10-CM | POA: Insufficient documentation

## 2017-02-13 DIAGNOSIS — I10 Essential (primary) hypertension: Secondary | ICD-10-CM | POA: Diagnosis not present

## 2017-02-13 DIAGNOSIS — M549 Dorsalgia, unspecified: Secondary | ICD-10-CM | POA: Diagnosis not present

## 2017-02-13 DIAGNOSIS — Z7982 Long term (current) use of aspirin: Secondary | ICD-10-CM | POA: Insufficient documentation

## 2017-02-13 DIAGNOSIS — R1031 Right lower quadrant pain: Secondary | ICD-10-CM | POA: Diagnosis not present

## 2017-02-13 DIAGNOSIS — R197 Diarrhea, unspecified: Secondary | ICD-10-CM | POA: Diagnosis not present

## 2017-02-13 DIAGNOSIS — R109 Unspecified abdominal pain: Secondary | ICD-10-CM | POA: Diagnosis not present

## 2017-02-13 DIAGNOSIS — K5730 Diverticulosis of large intestine without perforation or abscess without bleeding: Secondary | ICD-10-CM | POA: Diagnosis not present

## 2017-02-13 DIAGNOSIS — R110 Nausea: Secondary | ICD-10-CM | POA: Diagnosis not present

## 2017-02-13 LAB — CBC WITH DIFFERENTIAL/PLATELET
BASOS PCT: 1 %
Basophils Absolute: 0 10*3/uL (ref 0.0–0.1)
EOS ABS: 0.2 10*3/uL (ref 0.0–0.7)
Eosinophils Relative: 3 %
HEMATOCRIT: 40.3 % (ref 36.0–46.0)
Hemoglobin: 13.4 g/dL (ref 12.0–15.0)
Lymphocytes Relative: 41 %
Lymphs Abs: 2.3 10*3/uL (ref 0.7–4.0)
MCH: 25.3 pg — ABNORMAL LOW (ref 26.0–34.0)
MCHC: 33.3 g/dL (ref 30.0–36.0)
MCV: 76.2 fL — ABNORMAL LOW (ref 78.0–100.0)
MONO ABS: 0.6 10*3/uL (ref 0.1–1.0)
MONOS PCT: 11 %
NEUTROS ABS: 2.5 10*3/uL (ref 1.7–7.7)
Neutrophils Relative %: 44 %
Platelets: 187 10*3/uL (ref 150–400)
RBC: 5.29 MIL/uL — ABNORMAL HIGH (ref 3.87–5.11)
RDW: 16.4 % — AB (ref 11.5–15.5)
WBC: 5.6 10*3/uL (ref 4.0–10.5)

## 2017-02-13 LAB — COMPREHENSIVE METABOLIC PANEL
ALBUMIN: 3.8 g/dL (ref 3.5–5.0)
ALT: 17 U/L (ref 14–54)
ANION GAP: 9 (ref 5–15)
AST: 18 U/L (ref 15–41)
Alkaline Phosphatase: 86 U/L (ref 38–126)
BILIRUBIN TOTAL: 0.3 mg/dL (ref 0.3–1.2)
BUN: 22 mg/dL — ABNORMAL HIGH (ref 6–20)
CO2: 25 mmol/L (ref 22–32)
Calcium: 9 mg/dL (ref 8.9–10.3)
Chloride: 105 mmol/L (ref 101–111)
Creatinine, Ser: 0.98 mg/dL (ref 0.44–1.00)
GFR calc Af Amer: >60 mL/min (ref >60)
GFR calc non Af Amer: 55 mL/min — ABNORMAL LOW (ref >60)
GLUCOSE: 142 mg/dL — AB (ref 65–99)
Potassium: 3.6 mmol/L (ref 3.5–5.1)
Sodium: 139 mmol/L (ref 135–145)
TOTAL PROTEIN: 7.4 g/dL (ref 6.5–8.1)

## 2017-02-13 LAB — URINALYSIS, ROUTINE W REFLEX MICROSCOPIC
Bilirubin Urine: NEGATIVE
Hgb urine dipstick: NEGATIVE
KETONES UR: NEGATIVE mg/dL
NITRITE: NEGATIVE
PH: 5 (ref 5.0–8.0)
Protein, ur: NEGATIVE mg/dL
Specific Gravity, Urine: 1.027 (ref 1.005–1.030)

## 2017-02-13 LAB — LIPASE, BLOOD: LIPASE: 25 U/L (ref 11–51)

## 2017-02-13 MED ORDER — MORPHINE SULFATE (PF) 2 MG/ML IV SOLN
2.0000 mg | Freq: Once | INTRAVENOUS | Status: AC
  Administered 2017-02-13: 2 mg via INTRAVENOUS
  Filled 2017-02-13: qty 1

## 2017-02-13 MED ORDER — ONDANSETRON HCL 4 MG/2ML IJ SOLN
4.0000 mg | Freq: Once | INTRAMUSCULAR | Status: AC
  Administered 2017-02-13: 4 mg via INTRAVENOUS
  Filled 2017-02-13: qty 2

## 2017-02-13 NOTE — Discharge Instructions (Signed)
You were seen today for right-sided flank and abdominal pain.  Your workup is reassuring including urinalysis.  Your CT scan does not show any acute abnormalities.  It does show that you have a fair bit of stool in the right side of your colon which could be contributing.  Follow-up with your primary physician.  If you develop any new or worsening symptoms you should be reevaluated.

## 2017-02-13 NOTE — ED Provider Notes (Signed)
[facility]-EMERGENCY DEPT Provider Note   CSN: [id number] Arrival date & time: 02/13/17  0426     History   Chief Complaint Chief Complaint  Patient presents with  . Flank Pain    HPI Erica Buckley is a 77 y.o. female.  HPI  This is a 77 year old female with a history of diabetes, hypertension who presents with right flank pain.  Onset of flank pain this morning at approximately 3 AM.  She states over the last several days she has had abdominal cramping, nausea and diarrhea.  The diarrhea has improved.  She states that the pain is dull and "it feels like I have to go to the bathroom."  She reports urinary frequency without urgency or dysuria.  Denies fevers.  No history of kidney stones.  Currently she rates her pain 8 out of 10.  She has not taken anything for pain.  Past Medical History:  Diagnosis Date  . Anemia   . Diabetes mellitus without complication (HCC)    fasting 110s  . Headache(784.0)   . Hypertension   . PONV (postoperative nausea and vomiting)   . Ulcer   . Varicose veins     Patient Active Problem List   Diagnosis Date Noted  . Type II or unspecified type diabetes mellitus without mention of complication, not stated as uncontrolled 09/02/2013  . Osteoarthritis of left knee S/P Left total knee arthroplasty 08/29/2013  . Hypertension 08/29/2013  . Type II or unspecified type diabetes mellitus without mention of complication, uncontrolled 08/29/2013  . Unspecified constipation 08/29/2013  . Postoperative anemia due to acute blood loss 08/28/2013  . Total knee replacement status 08/25/2013  . Pain in limb- Right leg 08/01/2012  . Aftercare following surgery of the circulatory system, NEC 03/07/2012  . Varicose veins of lower extremities with ulcer and inflammation (HCC) 02/29/2012  . Unspecified venous (peripheral) insufficiency 02/29/2012  . Varicose veins of lower extremities with other complications 10/30/2011    Past  Surgical History:  Procedure Laterality Date  . ABDOMINAL HYSTERECTOMY    . ENDOVENOUS ABLATION SAPHENOUS VEIN W/ LASER  02-29-2012   right small saphenous vein  by Gretta Began MD  . ENDOVENOUS ABLATION SAPHENOUS VEIN W/ LASER Left 04-11-2012   left greater saphenous by Gretta Began MD  . KNEE ARTHROSCOPY Left   . TOTAL KNEE ARTHROPLASTY Left 08/25/2013   Procedure: LEFT TOTAL KNEE ARTHROPLASTY;  Surgeon: Harvie Junior, MD;  Location: [facility] OR;  Service: Orthopedics;  Laterality: Left;  Marland Kitchen VEIN LIGATION AND STRIPPING  1978    OB History    No data available       Home Medications    Prior to Admission medications   Medication Sig Start Date End Date Taking? Authorizing Provider  B Complex-C (B-COMPLEX WITH VITAMIN C) tablet Take 1 tablet by mouth daily.   Yes [provider]  diclofenac sodium (VOLTAREN) 1 % GEL Apply 2 g topically 2 (two) times daily.   Yes [provider]  empagliflozin (JARDIANCE) 10 MG TABS tablet Take 10 mg by mouth daily.   Yes [provider]  ibuprofen (ADVIL,MOTRIN) 800 MG tablet Take 800 mg by mouth every 8 (eight) hours as needed for moderate pain.   Yes [provider]  losartan-hydrochlorothiazide (HYZAAR) 50-12.5 MG per tablet Take 1 tablet by mouth daily.   Yes [provider]  metoprolol succinate (TOPROL-XL) 25 MG 24 hr tablet Take 25 mg by mouth daily.   Yes [provider]  aspirin EC 325 MG EC tablet Take 1 tablet (325 mg total) by mouth 2 (two) times daily. Patient not taking: Reported on 02/13/2017 08/28/13   Erica Buckley, [name], [name]  methocarbamol (ROBAXIN) 500 MG tablet Take 1 tablet (500 mg total) by mouth every 6 (six) hours as needed for muscle spasms. Patient not taking: Reported on 02/13/2017 08/28/13   Erica Buckley, [name], [name]  oxyCODONE (OXY IR/ROXICODONE) 5 MG immediate release tablet Take 1-2 tablets (5-10 mg total) by mouth every 3 (three) hours as needed for breakthrough pain. Patient not taking:  Reported on 02/13/2017 08/28/13   Erica Buckley, [name], [name]    Family History Family History  Problem Relation Age of Onset  . Cancer Father   . Diabetes Father     Social History Social History   Tobacco Use  . Smoking status: Never Smoker  . Smokeless tobacco: Never Used  Substance Use Topics  . Alcohol use: Yes    Alcohol/week: 0.6 oz    Types: 1 Glasses of wine per week  . Drug use: No     Allergies   Codeine   Review of Systems Review of Systems  Constitutional: Negative for fever.  Respiratory: Negative for shortness of breath.   Cardiovascular: Negative for chest pain.  Gastrointestinal: Positive for abdominal pain, diarrhea and nausea. Negative for vomiting.  Genitourinary: Positive for flank pain.  Musculoskeletal: Positive for back pain.  Neurological: Positive for dizziness.  All other systems reviewed and are negative.    Physical Exam Updated Vital Signs BP (!) 143/67   Pulse 62   Temp 97.8 F (36.6 C) (Oral)   Resp 18   SpO2 96%   Physical Exam  Constitutional: She is oriented to person, place, and time.  Elderly, overweight, no acute distress  HENT:  Head: Normocephalic and atraumatic.  Cardiovascular: Normal rate, regular rhythm and normal heart sounds.  Pulmonary/Chest: Effort normal. No respiratory distress. She has no wheezes.  Abdominal: Soft. Bowel sounds are normal. She exhibits no mass. There is tenderness. There is no guarding.  Right CVA tenderness to palpation as well as right lower quadrant tenderness to palpation, no rebound or guarding noted  Neurological: She is alert and oriented to person, place, and time.  Skin: Skin is warm and dry.  Psychiatric: She has a normal mood and affect.  Nursing note and vitals reviewed.    ED Treatments / Results  Labs (all labs ordered are listed, but only abnormal results are displayed) Labs Reviewed  URINALYSIS, ROUTINE W REFLEX MICROSCOPIC - Abnormal; Notable for the following components:       Result Value   Glucose, UA >=500 (*)    Leukocytes, UA MODERATE (*)    Bacteria, UA RARE (*)    Squamous Epithelial / LPF 0-5 (*)    All other components within normal limits  CBC WITH DIFFERENTIAL/PLATELET - Abnormal; Notable for the following components:   RBC 5.29 (*)    MCV 76.2 (*)    MCH 25.3 (*)    RDW 16.4 (*)    All other components within normal limits  COMPREHENSIVE METABOLIC PANEL - Abnormal; Notable for the following components:   Glucose, Bld 142 (*)    BUN 22 (*)    GFR calc non Af Amer 55 (*)    All other components within normal limits  LIPASE, BLOOD    EKG  EKG Interpretation None       Radiology Ct Renal Stone Study  Result Date: 02/13/2017 CLINICAL DATA:  77 year old female with flank pain and urinary frequency. EXAM: CT ABDOMEN AND PELVIS WITHOUT CONTRAST TECHNIQUE: Multidetector CT imaging of the abdomen and pelvis was performed following the standard protocol without IV contrast. COMPARISON:  Lumbar spine radiograph dated 01/13/2010 FINDINGS: Evaluation of this exam is limited in the absence of intravenous contrast. Lower chest: The visualized lung bases are clear. No intra-abdominal free air or free fluid Hepatobiliary: Cholecystectomy. Mild fatty infiltration of the liver. No intrahepatic biliary ductal dilatation. Pancreas: Unremarkable. No pancreatic ductal dilatation or surrounding inflammatory changes. Spleen: Normal in size without focal abnormality. Adrenals/Urinary Tract: The adrenal glands are unremarkable. There is no hydronephrosis or nephrolithiasis on either side. Ill-defined 15 mm hypodense lesion in the superior pole of the left kidney is not well characterized on this noncontrast CT. Ultrasound may provide better evaluation. The visualized ureters and urinary bladder appear unremarkable. Stomach/Bowel: There is sigmoid diverticulosis without active inflammatory changes. No bowel obstruction or active inflammation. The appendix is not visualized  with certainty. No inflammatory changes identified in the right lower quadrant. Vascular/Lymphatic: The abdominal aorta and IVC are grossly unremarkable on this noncontrast CT. No portal venous gas. There is no adenopathy. Reproductive: Hysterectomy.  No pelvic mass. Other: Small fat containing umbilical hernia.  No fluid collection Musculoskeletal: Mild degenerative changes of the spine. No acute osseous pathology. IMPRESSION: 1. No acute intra-abdominopelvic pathology. No hydronephrosis or nephrolithiasis. 2. Sigmoid diverticulosis. No bowel obstruction or active inflammation. Electronically Signed   By: Elgie Collard M.D.   On: 02/13/2017 06:39    Procedures Procedures (including critical care time)  Medications Ordered in ED Medications  morphine 2 MG/ML injection 2 mg (2 mg Intravenous Given 02/13/17 0525)  ondansetron (ZOFRAN) injection 4 mg (4 mg Intravenous Given 02/13/17 0525)     Initial Impression / Assessment and Plan / ED Course  I have reviewed the triage vital signs and the nursing notes.  Pertinent labs & imaging results that were available during my care of the patient were reviewed by me and considered in my medical decision making (see chart for details).     Patient presents with right flank pain.  Recent history of abdominal cramping and diarrhea.  She is overall nontoxic appearing.  Vital signs reassuring.  Some reproducible right flank and right lower quadrant tenderness to palpation.  Lab work obtained.  Patient given pain medication.  Initial lab workup is largely reassuring including urinalysis and basic labs.  No significant leukocytosis.  Considerations include kidney stone, gastroenteritis, less likely appendicitis.  No rash to indicate shingles.  CT scan obtained to evaluate for kidney stone.  CT scan is negative for acute abdominal process.  I have reviewed the CT scan from myself.  She does appear to have some stool burden in the right colon which could be  contributing; although she reports diarrhea over the last several days.  Discussed the results with patient.  She clinically is improved.  Follow-up with primary physician recommended.  After history, exam, and medical workup I feel the patient has been appropriately medically screened and is safe for discharge home. Pertinent diagnoses were discussed with the patient. Patient was given return precautions.   Final Clinical Impressions(s) / ED Diagnoses   Final diagnoses:  Flank pain  Right lower quadrant abdominal pain    ED Discharge Orders    None       Shon Baton, MD 02/13/17 [phone]

## 2017-02-13 NOTE — ED Triage Notes (Signed)
Pt complains of flank pain that woke her up this am Pt also states that she has urinary frequency

## 2017-03-06 DIAGNOSIS — M7052 Other bursitis of knee, left knee: Secondary | ICD-10-CM | POA: Diagnosis not present

## 2017-03-08 ENCOUNTER — Other Ambulatory Visit (HOSPITAL_COMMUNITY): Payer: Self-pay | Admitting: Orthopedic Surgery

## 2017-03-08 DIAGNOSIS — Z96659 Presence of unspecified artificial knee joint: Principal | ICD-10-CM

## 2017-03-08 DIAGNOSIS — T8484XD Pain due to internal orthopedic prosthetic devices, implants and grafts, subsequent encounter: Secondary | ICD-10-CM

## 2017-03-14 ENCOUNTER — Encounter (HOSPITAL_COMMUNITY)
Admission: RE | Admit: 2017-03-14 | Discharge: 2017-03-14 | Disposition: A | Discharge status: Home / Self Care | Payer: Medicare HMO | Source: Ambulatory Visit | Attending: Orthopedic Surgery | Admitting: Orthopedic Surgery

## 2017-03-14 DIAGNOSIS — M1711 Unilateral primary osteoarthritis, right knee: Secondary | ICD-10-CM | POA: Insufficient documentation

## 2017-03-14 DIAGNOSIS — T8484XD Pain due to internal orthopedic prosthetic devices, implants and grafts, subsequent encounter: Secondary | ICD-10-CM

## 2017-03-14 DIAGNOSIS — R948 Abnormal results of function studies of other organs and systems: Secondary | ICD-10-CM | POA: Diagnosis not present

## 2017-03-14 DIAGNOSIS — Z96659 Presence of unspecified artificial knee joint: Secondary | ICD-10-CM | POA: Diagnosis not present

## 2017-03-14 MED ORDER — TECHNETIUM TC 99M MEDRONATE IV KIT
25.0000 | PACK | Freq: Once | INTRAVENOUS | Status: AC | PRN
  Administered 2017-03-14: 25 via INTRAVENOUS

## 2017-03-22 DIAGNOSIS — M25562 Pain in left knee: Secondary | ICD-10-CM | POA: Diagnosis not present

## 2017-05-01 DIAGNOSIS — R1032 Left lower quadrant pain: Secondary | ICD-10-CM | POA: Diagnosis not present

## 2017-05-03 ENCOUNTER — Other Ambulatory Visit: Payer: Self-pay | Admitting: Family Medicine

## 2017-05-03 DIAGNOSIS — R1032 Left lower quadrant pain: Secondary | ICD-10-CM

## 2017-05-09 ENCOUNTER — Ambulatory Visit
Admission: RE | Admit: 2017-05-09 | Discharge: 2017-05-09 | Disposition: A | Discharge status: Home / Self Care | Payer: Medicare HMO | Source: Ambulatory Visit | Attending: Family Medicine | Admitting: Family Medicine

## 2017-05-09 DIAGNOSIS — K5730 Diverticulosis of large intestine without perforation or abscess without bleeding: Secondary | ICD-10-CM | POA: Diagnosis not present

## 2017-05-09 DIAGNOSIS — R1032 Left lower quadrant pain: Secondary | ICD-10-CM

## 2017-05-09 MED ORDER — IOPAMIDOL (ISOVUE-300) INJECTION 61%
125.0000 mL | Freq: Once | INTRAVENOUS | Status: AC | PRN
  Administered 2017-05-09: 125 mL via INTRAVENOUS

## 2017-05-14 ENCOUNTER — Emergency Department (HOSPITAL_COMMUNITY)
Admission: EM | Admit: 2017-05-14 | Discharge: 2017-05-14 | Disposition: A | Discharge status: Home / Self Care | Payer: Medicare HMO | Attending: Emergency Medicine | Admitting: Emergency Medicine

## 2017-05-14 ENCOUNTER — Encounter (HOSPITAL_COMMUNITY): Payer: Self-pay | Admitting: Emergency Medicine

## 2017-05-14 ENCOUNTER — Emergency Department (HOSPITAL_COMMUNITY): Payer: Medicare HMO

## 2017-05-14 DIAGNOSIS — Y939 Activity, unspecified: Secondary | ICD-10-CM | POA: Insufficient documentation

## 2017-05-14 DIAGNOSIS — I10 Essential (primary) hypertension: Secondary | ICD-10-CM | POA: Insufficient documentation

## 2017-05-14 DIAGNOSIS — Z79899 Other long term (current) drug therapy: Secondary | ICD-10-CM | POA: Diagnosis not present

## 2017-05-14 DIAGNOSIS — Z96652 Presence of left artificial knee joint: Secondary | ICD-10-CM | POA: Diagnosis not present

## 2017-05-14 DIAGNOSIS — Y92410 Unspecified street and highway as the place of occurrence of the external cause: Secondary | ICD-10-CM | POA: Diagnosis not present

## 2017-05-14 DIAGNOSIS — S86912A Strain of unspecified muscle(s) and tendon(s) at lower leg level, left leg, initial encounter: Secondary | ICD-10-CM | POA: Diagnosis not present

## 2017-05-14 DIAGNOSIS — M542 Cervicalgia: Secondary | ICD-10-CM | POA: Diagnosis not present

## 2017-05-14 DIAGNOSIS — T148XXA Other injury of unspecified body region, initial encounter: Secondary | ICD-10-CM

## 2017-05-14 DIAGNOSIS — E119 Type 2 diabetes mellitus without complications: Secondary | ICD-10-CM | POA: Insufficient documentation

## 2017-05-14 DIAGNOSIS — Y998 Other external cause status: Secondary | ICD-10-CM | POA: Insufficient documentation

## 2017-05-14 DIAGNOSIS — S20211A Contusion of right front wall of thorax, initial encounter: Secondary | ICD-10-CM | POA: Diagnosis not present

## 2017-05-14 DIAGNOSIS — S299XXA Unspecified injury of thorax, initial encounter: Secondary | ICD-10-CM | POA: Diagnosis present

## 2017-05-14 DIAGNOSIS — Z7982 Long term (current) use of aspirin: Secondary | ICD-10-CM | POA: Diagnosis not present

## 2017-05-14 DIAGNOSIS — S60311A Abrasion of right thumb, initial encounter: Secondary | ICD-10-CM | POA: Insufficient documentation

## 2017-05-14 DIAGNOSIS — S8992XA Unspecified injury of left lower leg, initial encounter: Secondary | ICD-10-CM | POA: Diagnosis not present

## 2017-05-14 DIAGNOSIS — R079 Chest pain, unspecified: Secondary | ICD-10-CM | POA: Diagnosis not present

## 2017-05-14 DIAGNOSIS — M25562 Pain in left knee: Secondary | ICD-10-CM | POA: Diagnosis not present

## 2017-05-14 MED ORDER — CYCLOBENZAPRINE HCL 10 MG PO TABS: 10.0000 mg | ORAL_TABLET | Freq: Two times a day (BID) | ORAL | 0 refills | Status: DC | PRN

## 2017-05-14 MED ORDER — TRAMADOL HCL 50 MG PO TABS: 50.0000 mg | ORAL_TABLET | Freq: Four times a day (QID) | ORAL | 0 refills | Status: DC | PRN

## 2017-05-14 MED FILL — CYCLOBENZAPRINE 10 MG TAB: 10 | 10 days supply | Qty: 20 | Fill #0
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
## 2017-05-14 NOTE — Discharge Instructions (Addendum)
Take the medications as needed for pain, occasionally people who  have a codeine allergy can have a reaction to the Ultram medication.  If you start having any similar symptoms immediately discontinue the Ultram.

## 2017-05-14 NOTE — ED Notes (Signed)
Patient states they removed their C-collar prior to being seen by EDP. Patient transported to X-ray.

## 2017-05-14 NOTE — ED Notes (Signed)
ED Provider at bedside. 

## 2017-05-14 NOTE — ED Notes (Signed)
RX X 2 GIVEN 

## 2017-05-14 NOTE — ED Notes (Signed)
ED Provider at bedside. J KNAPP 
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
## 2017-05-16 ENCOUNTER — Other Ambulatory Visit: Payer: Medicare HMO

## 2017-05-22 DIAGNOSIS — S60511D Abrasion of right hand, subsequent encounter: Secondary | ICD-10-CM | POA: Diagnosis not present

## 2017-05-22 DIAGNOSIS — S20219D Contusion of unspecified front wall of thorax, subsequent encounter: Secondary | ICD-10-CM | POA: Diagnosis not present

## 2017-05-22 DIAGNOSIS — S8002XD Contusion of left knee, subsequent encounter: Secondary | ICD-10-CM | POA: Diagnosis not present

## 2017-05-22 DIAGNOSIS — S161XXD Strain of muscle, fascia and tendon at neck level, subsequent encounter: Secondary | ICD-10-CM | POA: Diagnosis not present

## 2017-05-28 DIAGNOSIS — M25562 Pain in left knee: Secondary | ICD-10-CM | POA: Diagnosis not present

## 2017-05-28 DIAGNOSIS — S134XXD Sprain of ligaments of cervical spine, subsequent encounter: Secondary | ICD-10-CM | POA: Diagnosis not present

## 2017-05-28 DIAGNOSIS — Z96652 Presence of left artificial knee joint: Secondary | ICD-10-CM | POA: Diagnosis not present

## 2017-05-28 DIAGNOSIS — M542 Cervicalgia: Secondary | ICD-10-CM | POA: Diagnosis not present

## 2017-05-31 DIAGNOSIS — Z96652 Presence of left artificial knee joint: Secondary | ICD-10-CM | POA: Diagnosis not present

## 2017-05-31 DIAGNOSIS — S134XXD Sprain of ligaments of cervical spine, subsequent encounter: Secondary | ICD-10-CM | POA: Diagnosis not present

## 2017-05-31 DIAGNOSIS — M25562 Pain in left knee: Secondary | ICD-10-CM | POA: Diagnosis not present

## 2017-05-31 DIAGNOSIS — M542 Cervicalgia: Secondary | ICD-10-CM | POA: Diagnosis not present

## 2017-06-04 DIAGNOSIS — Z96652 Presence of left artificial knee joint: Secondary | ICD-10-CM | POA: Diagnosis not present

## 2017-06-04 DIAGNOSIS — S134XXD Sprain of ligaments of cervical spine, subsequent encounter: Secondary | ICD-10-CM | POA: Diagnosis not present

## 2017-06-04 DIAGNOSIS — M542 Cervicalgia: Secondary | ICD-10-CM | POA: Diagnosis not present

## 2017-06-04 DIAGNOSIS — M25562 Pain in left knee: Secondary | ICD-10-CM | POA: Diagnosis not present

## 2017-06-13 DIAGNOSIS — Z96652 Presence of left artificial knee joint: Secondary | ICD-10-CM | POA: Diagnosis not present

## 2017-06-13 DIAGNOSIS — M25562 Pain in left knee: Secondary | ICD-10-CM | POA: Diagnosis not present

## 2017-06-13 DIAGNOSIS — M542 Cervicalgia: Secondary | ICD-10-CM | POA: Diagnosis not present

## 2017-06-13 DIAGNOSIS — S134XXD Sprain of ligaments of cervical spine, subsequent encounter: Secondary | ICD-10-CM | POA: Diagnosis not present

## 2017-06-18 DIAGNOSIS — M542 Cervicalgia: Secondary | ICD-10-CM | POA: Diagnosis not present

## 2017-06-18 DIAGNOSIS — Z96652 Presence of left artificial knee joint: Secondary | ICD-10-CM | POA: Diagnosis not present

## 2017-06-18 DIAGNOSIS — M25562 Pain in left knee: Secondary | ICD-10-CM | POA: Diagnosis not present

## 2017-06-18 DIAGNOSIS — S134XXD Sprain of ligaments of cervical spine, subsequent encounter: Secondary | ICD-10-CM | POA: Diagnosis not present

## 2017-06-20 DIAGNOSIS — M542 Cervicalgia: Secondary | ICD-10-CM | POA: Diagnosis not present

## 2017-06-20 DIAGNOSIS — M25562 Pain in left knee: Secondary | ICD-10-CM | POA: Diagnosis not present

## 2017-06-20 DIAGNOSIS — S134XXD Sprain of ligaments of cervical spine, subsequent encounter: Secondary | ICD-10-CM | POA: Diagnosis not present

## 2017-06-20 DIAGNOSIS — Z96652 Presence of left artificial knee joint: Secondary | ICD-10-CM | POA: Diagnosis not present

## 2017-06-26 DIAGNOSIS — Z96652 Presence of left artificial knee joint: Secondary | ICD-10-CM | POA: Diagnosis not present

## 2017-06-26 DIAGNOSIS — M542 Cervicalgia: Secondary | ICD-10-CM | POA: Diagnosis not present

## 2017-06-26 DIAGNOSIS — S134XXD Sprain of ligaments of cervical spine, subsequent encounter: Secondary | ICD-10-CM | POA: Diagnosis not present

## 2017-06-26 DIAGNOSIS — M25562 Pain in left knee: Secondary | ICD-10-CM | POA: Diagnosis not present

## 2017-07-02 DIAGNOSIS — M25562 Pain in left knee: Secondary | ICD-10-CM | POA: Diagnosis not present

## 2017-07-02 DIAGNOSIS — S134XXD Sprain of ligaments of cervical spine, subsequent encounter: Secondary | ICD-10-CM | POA: Diagnosis not present

## 2017-07-02 DIAGNOSIS — M542 Cervicalgia: Secondary | ICD-10-CM | POA: Diagnosis not present

## 2017-07-02 DIAGNOSIS — Z96652 Presence of left artificial knee joint: Secondary | ICD-10-CM | POA: Diagnosis not present

## 2017-07-16 DIAGNOSIS — Z96652 Presence of left artificial knee joint: Secondary | ICD-10-CM | POA: Diagnosis not present

## 2017-07-16 DIAGNOSIS — S134XXD Sprain of ligaments of cervical spine, subsequent encounter: Secondary | ICD-10-CM | POA: Diagnosis not present

## 2017-07-16 DIAGNOSIS — M25562 Pain in left knee: Secondary | ICD-10-CM | POA: Diagnosis not present

## 2017-07-16 DIAGNOSIS — M542 Cervicalgia: Secondary | ICD-10-CM | POA: Diagnosis not present

## 2017-08-29 ENCOUNTER — Other Ambulatory Visit: Payer: Self-pay | Admitting: Family Medicine

## 2017-08-29 DIAGNOSIS — N644 Mastodynia: Secondary | ICD-10-CM | POA: Diagnosis not present

## 2017-08-29 DIAGNOSIS — M722 Plantar fascial fibromatosis: Secondary | ICD-10-CM | POA: Diagnosis not present

## 2017-09-03 ENCOUNTER — Other Ambulatory Visit: Payer: Self-pay | Admitting: Family Medicine

## 2017-09-05 ENCOUNTER — Ambulatory Visit
Admission: RE | Admit: 2017-09-05 | Discharge: 2017-09-05 | Disposition: A | Discharge status: Home / Self Care | Payer: Medicare HMO | Source: Ambulatory Visit | Attending: Family Medicine | Admitting: Family Medicine

## 2017-09-05 DIAGNOSIS — R928 Other abnormal and inconclusive findings on diagnostic imaging of breast: Secondary | ICD-10-CM | POA: Diagnosis not present

## 2017-09-05 DIAGNOSIS — N644 Mastodynia: Secondary | ICD-10-CM

## 2017-09-05 DIAGNOSIS — N6001 Solitary cyst of right breast: Secondary | ICD-10-CM | POA: Diagnosis not present

## 2017-09-18 DIAGNOSIS — M722 Plantar fascial fibromatosis: Secondary | ICD-10-CM | POA: Diagnosis not present

## 2017-09-18 DIAGNOSIS — M7052 Other bursitis of knee, left knee: Secondary | ICD-10-CM | POA: Diagnosis not present

## 2017-10-22 DIAGNOSIS — I129 Hypertensive chronic kidney disease with stage 1 through stage 4 chronic kidney disease, or unspecified chronic kidney disease: Secondary | ICD-10-CM | POA: Diagnosis not present

## 2017-10-22 DIAGNOSIS — Z1389 Encounter for screening for other disorder: Secondary | ICD-10-CM | POA: Diagnosis not present

## 2017-10-22 DIAGNOSIS — K640 First degree hemorrhoids: Secondary | ICD-10-CM | POA: Diagnosis not present

## 2017-10-22 DIAGNOSIS — E1121 Type 2 diabetes mellitus with diabetic nephropathy: Secondary | ICD-10-CM | POA: Diagnosis not present

## 2017-10-22 DIAGNOSIS — N182 Chronic kidney disease, stage 2 (mild): Secondary | ICD-10-CM | POA: Diagnosis not present

## 2017-12-09 DIAGNOSIS — R69 Illness, unspecified: Secondary | ICD-10-CM | POA: Diagnosis not present

## 2018-01-09 DIAGNOSIS — N907 Vulvar cyst: Secondary | ICD-10-CM | POA: Diagnosis not present

## 2018-01-14 ENCOUNTER — Other Ambulatory Visit: Payer: Self-pay | Admitting: Obstetrics and Gynecology

## 2018-01-14 ENCOUNTER — Other Ambulatory Visit (HOSPITAL_COMMUNITY)
Admission: RE | Admit: 2018-01-14 | Discharge: 2018-01-14 | Disposition: A | Discharge status: Home / Self Care | Payer: Medicare HMO | Source: Ambulatory Visit | Attending: Obstetrics and Gynecology | Admitting: Obstetrics and Gynecology

## 2018-01-14 DIAGNOSIS — N907 Vulvar cyst: Secondary | ICD-10-CM | POA: Insufficient documentation

## 2018-01-14 DIAGNOSIS — N762 Acute vulvitis: Secondary | ICD-10-CM | POA: Diagnosis not present

## 2018-02-06 ENCOUNTER — Telehealth: Payer: Self-pay | Admitting: *Deleted

## 2018-02-06 NOTE — Telephone Encounter (Signed)
Called and spoke with the patient, gave the appt for 1/16 at 9:45 am

## 2018-02-06 NOTE — Telephone Encounter (Signed)
Returned the patient's call and gave the appt information for her appt next week

## 2018-02-12 NOTE — H&P (View-Only) (Signed)
Consult Note: Gyn-Onc  Consult was requested by Dr. Charlotta Newtonzan for the evaluation of Erica Buckley 78 y.o. female  CC: Bartholin's mass  Assessment/Plan:  Erica Buckley is a 78 y.o. who is status post drainage of a right Bartholin cyst on January 14, 2018.  Of concern at the time of drainage is that there was significant bleeding without gross evidence of pus.  On examination residual firmness/subtle nodularity is palpable at the base of the Bartholin's gland.  The vaginal mucosa is intact and there is no tenderness.    Because of the concern for bartholin's gland cancer in a postmenopausal woman, I recommend marsupialization of the right Bartholin's gland with biopsies of the base of the gland. I counseled her that if the biopsies are negative then close follow-up will be recommended.  I also recommended removal of the gland if this recurs.    Dr. Tawnya CrookShawna Phelps will perform the procedure on February 28, 2018.  Erica Buckley is aware, all of her questions were answered regarding postoperative care.  HPI: Erica Buckley is a 78 y.o. who noted a right-sided vulvar mass when in the shower at the end of November 2019 .  She presented to Dr. Charlotta Newtonzan and at that time the Bartholin's cyst was incised with the  return of copious blood,without gross purulent material.  No malignant cells were identified in the fluid sent to cytology but there was a predominance of neutrophilic inflammation.  Culture of the Bartholin cyst fluid was negative. Erica Buckley reports a residual mass in the area that is nontender.    She reports weight gain since adjustments in her diabetes medication,  occasional nausea since December 2019 and general fatigue.  Erica Buckley denies any history of Bartholin's abscesses or cysts in her reproductive life prior to this episode.  She states that her HgB A1c is close to 6.1  Review of Systems:  Constitutional  Feels fatigued, reports weight  gain Cardiovascular  No chest pain, shortness of breath, or edema  Pulmonary  No cough or wheeze.  Gastro Intestinal  No nausea, vomitting, or diarrhoea. No bright red blood per rectum, no abdominal pain, change in bowel movement, or constipation.  Genito Urinary  No frequency, urgency, dysuria, fullness in the right vaginal area Musculo Skeletal  No myalgia, arthralgia, joint swelling or pain  Neurologic  No weakness, numbness, change in gait,  Psychology  No depression, anxiety, insomnia.    Current Meds:  Outpatient Encounter Medications as of 02/14/2018  Medication Sig  . aspirin EC 325 MG EC tablet Take 1 tablet (325 mg total) by mouth 2 (two) times daily. (Patient not taking: Reported on 02/13/2017)  . B Complex-C (B-COMPLEX WITH VITAMIN C) tablet Take 1 tablet by mouth daily.  . cyclobenzaprine (FLEXERIL) 10 MG tablet Take 1 tablet (10 mg total) by mouth 2 (two) times daily as needed for muscle spasms.  . diclofenac sodium (VOLTAREN) 1 % GEL Apply 2 g topically 2 (two) times daily.  . empagliflozin (JARDIANCE) 10 MG TABS tablet Take 10 mg by mouth daily.  Marland Kitchen. ibuprofen (ADVIL,MOTRIN) 800 MG tablet Take 800 mg by mouth every 8 (eight) hours as needed for moderate pain.  Marland Kitchen. losartan-hydrochlorothiazide (HYZAAR) 50-12.5 MG per tablet Take 1 tablet by mouth daily.  . methocarbamol (ROBAXIN) 500 MG tablet Take 1 tablet (500 mg total) by mouth every 6 (six) hours as needed for muscle spasms. (Patient not taking: Reported on 02/13/2017)  . metoprolol succinate (  TOPROL-XL) 25 MG 24 hr tablet Take 25 mg by mouth daily.  Marland Kitchen oxyCODONE (OXY IR/ROXICODONE) 5 MG immediate release tablet Take 1-2 tablets (5-10 mg total) by mouth every 3 (three) hours as needed for breakthrough pain. (Patient not taking: Reported on 02/13/2017)  . traMADol (ULTRAM) 50 MG tablet Take 1 tablet (50 mg total) by mouth every 6 (six) hours as needed.   No facility-administered encounter medications on file as of 02/14/2018.      Allergy:  Allergies  Allergen Reactions  . Codeine Shortness Of Breath    Social Hx:   Social History   Socioeconomic History  . Marital status: Widowed    Spouse name: Not on file  . Number of children: Not on file  . Years of education: Not on file  . Highest education level: Not on file  Occupational History  . Not on file  Social Needs  . Financial resource strain: Not on file  . Food insecurity:    Worry: Not on file    Inability: Not on file  . Transportation needs:    Medical: Not on file    Non-medical: Not on file  Tobacco Use  . Smoking status: Never Smoker  . Smokeless tobacco: Never Used  Substance and Sexual Activity  . Alcohol use: Yes    Alcohol/week: 1.0 standard drinks    Types: 1 Glasses of wine per week  . Drug use: No  . Sexual activity: Not on file  Lifestyle  . Physical activity:    Days per week: Not on file    Minutes per session: Not on file  . Stress: Not on file  Relationships  . Social connections:    Talks on phone: Not on file    Gets together: Not on file    Attends religious service: Not on file    Active member of club or organization: Not on file    Attends meetings of clubs or organizations: Not on file    Relationship status: Not on file  . Intimate partner violence:    Fear of current or ex partner: Not on file    Emotionally abused: Not on file    Physically abused: Not on file    Forced sexual activity: Not on file  Other Topics Concern  . Not on file  Social History Narrative  . Not on file    Past Surgical Hx:  Past Surgical History:  Procedure Laterality Date  . ABDOMINAL HYSTERECTOMY    . ENDOVENOUS ABLATION SAPHENOUS VEIN W/ LASER  02-29-2012   right small saphenous vein  by Gretta Began MD  . ENDOVENOUS ABLATION SAPHENOUS VEIN W/ LASER Left 04-11-2012   left greater saphenous by Gretta Began MD  . KNEE ARTHROSCOPY Left   . TOTAL KNEE ARTHROPLASTY Left 08/25/2013   Procedure: LEFT TOTAL KNEE ARTHROPLASTY;   Surgeon: Harvie Junior, MD;  Location: [facility] OR;  Service: Orthopedics;  Laterality: Left;  Marland Kitchen VEIN LIGATION AND STRIPPING  1978    Past Medical Hx:  Past Medical History:  Diagnosis Date  . Anemia   . Diabetes mellitus without complication (HCC)    fasting 110s  . Headache(784.0)   . Hypertension   . PONV (postoperative nausea and vomiting)   . Ulcer   . Varicose veins     Past Gynecological History: Gravida 4 para 3 hysterectomy in 1988 for menorrhagia from fibroids no history of abnormal Pap test normal spontaneous vaginal delivery x3 No LMP recorded. Patient has had  a hysterectomy.  Family Hx:  Family History  Problem Relation Age of Onset  . Cancer Father   . Diabetes Father     Vitals:  Blood pressure 115/65, pulse 63, temperature 98.6 F (37 C), temperature source Oral, resp. rate 20, height 5' 6.5" (1.689 m), weight 228 lb 4.8 oz (103.6 kg), SpO2 99 %.  Physical Exam: WD in NAD Neck  Supple NROM, without any enlargements.  Lymph Node Survey No cervical supraclavicular or inguinal adenopathy Cardiovascular  Pulse normal rate, regularity and rhythm. Lungs  Clear to auscultation bilaterally, Good air movement.  Skin  No rash/lesions/breakdown  Psychiatry  Alert and oriented appropriate mood affect speech and reasoning. Abdomen  Normoactive bowel sounds, abdomen soft, non-tender. Surgical  sites intact without evidence of hernia.  Back No CVA tenderness Genito Urinary Bartholin's glad with subtle nodularity at the base.  Non tender, no edema. Vulva/vagina     Bladder/urethra:  No lesions or masses  Vagina: atrophic no masses  Rectal  Good tone, no masses no cul de sac nodularity.  Extremities  No bilateral cyanosis, clubbing or edema.

## 2018-02-12 NOTE — Progress Notes (Signed)
Consult Note: Gyn-Onc  Consult was requested by Dr. Ozan for the evaluation of Erica Buckley 77 y.o. female  CC: Bartholin's mass  Assessment/Plan:  Erica Buckley is a 77 y.o. who is status post drainage of a right Bartholin cyst on January 14, 2018.  Of concern at the time of drainage is that there was significant bleeding without gross evidence of pus.  On examination residual firmness/subtle nodularity is palpable at the base of the Bartholin's gland.  The vaginal mucosa is intact and there is no tenderness.    Because of the concern for bartholin's gland cancer in a postmenopausal woman, I recommend marsupialization of the right Bartholin's gland with biopsies of the base of the gland. I counseled her that if the biopsies are negative then close follow-up will be recommended.  I also recommended removal of the gland if this recurs.    Dr. Shawna Phelps will perform the procedure on February 28, 2018.  Erica Buckley is aware, all of her questions were answered regarding postoperative care.  HPI: Erica Buckley is a 77 y.o. who noted a right-sided vulvar mass when in the shower at the end of November 2019 .  She presented to Dr. Ozan and at that time the Bartholin's cyst was incised with the  return of copious blood,without gross purulent material.  No malignant cells were identified in the fluid sent to cytology but there was a predominance of neutrophilic inflammation.  Culture of the Bartholin cyst fluid was negative. Kahlee L Carthen reports a residual mass in the area that is nontender.    She reports weight gain since adjustments in her diabetes medication,  occasional nausea since December 2019 and general fatigue.  Milica L Kenner denies any history of Bartholin's abscesses or cysts in her reproductive life prior to this episode.  She states that her HgB A1c is close to 6.1  Review of Systems:  Constitutional  Feels fatigued, reports weight  gain Cardiovascular  No chest pain, shortness of breath, or edema  Pulmonary  No cough or wheeze.  Gastro Intestinal  No nausea, vomitting, or diarrhoea. No bright red blood per rectum, no abdominal pain, change in bowel movement, or constipation.  Genito Urinary  No frequency, urgency, dysuria, fullness in the right vaginal area Musculo Skeletal  No myalgia, arthralgia, joint swelling or pain  Neurologic  No weakness, numbness, change in gait,  Psychology  No depression, anxiety, insomnia.    Current Meds:  Outpatient Encounter Medications as of 02/14/2018  Medication Sig  . aspirin EC 325 MG EC tablet Take 1 tablet (325 mg total) by mouth 2 (two) times daily. (Patient not taking: Reported on 02/13/2017)  . B Complex-C (B-COMPLEX WITH VITAMIN C) tablet Take 1 tablet by mouth daily.  . cyclobenzaprine (FLEXERIL) 10 MG tablet Take 1 tablet (10 mg total) by mouth 2 (two) times daily as needed for muscle spasms.  . diclofenac sodium (VOLTAREN) 1 % GEL Apply 2 g topically 2 (two) times daily.  . empagliflozin (JARDIANCE) 10 MG TABS tablet Take 10 mg by mouth daily.  . ibuprofen (ADVIL,MOTRIN) 800 MG tablet Take 800 mg by mouth every 8 (eight) hours as needed for moderate pain.  . losartan-hydrochlorothiazide (HYZAAR) 50-12.5 MG per tablet Take 1 tablet by mouth daily.  . methocarbamol (ROBAXIN) 500 MG tablet Take 1 tablet (500 mg total) by mouth every 6 (six) hours as needed for muscle spasms. (Patient not taking: Reported on 02/13/2017)  . metoprolol succinate (  TOPROL-XL) 25 MG 24 hr tablet Take 25 mg by mouth daily.  Marland Kitchen oxyCODONE (OXY IR/ROXICODONE) 5 MG immediate release tablet Take 1-2 tablets (5-10 mg total) by mouth every 3 (three) hours as needed for breakthrough pain. (Patient not taking: Reported on 02/13/2017)  . traMADol (ULTRAM) 50 MG tablet Take 1 tablet (50 mg total) by mouth every 6 (six) hours as needed.   No facility-administered encounter medications on file as of 02/14/2018.      Allergy:  Allergies  Allergen Reactions  . Codeine Shortness Of Breath    Social Hx:   Social History   Socioeconomic History  . Marital status: Widowed    Spouse name: Not on file  . Number of children: Not on file  . Years of education: Not on file  . Highest education level: Not on file  Occupational History  . Not on file  Social Needs  . Financial resource strain: Not on file  . Food insecurity:    Worry: Not on file    Inability: Not on file  . Transportation needs:    Medical: Not on file    Non-medical: Not on file  Tobacco Use  . Smoking status: Never Smoker  . Smokeless tobacco: Never Used  Substance and Sexual Activity  . Alcohol use: Yes    Alcohol/week: 1.0 standard drinks    Types: 1 Glasses of wine per week  . Drug use: No  . Sexual activity: Not on file  Lifestyle  . Physical activity:    Days per week: Not on file    Minutes per session: Not on file  . Stress: Not on file  Relationships  . Social connections:    Talks on phone: Not on file    Gets together: Not on file    Attends religious service: Not on file    Active member of club or organization: Not on file    Attends meetings of clubs or organizations: Not on file    Relationship status: Not on file  . Intimate partner violence:    Fear of current or ex partner: Not on file    Emotionally abused: Not on file    Physically abused: Not on file    Forced sexual activity: Not on file  Other Topics Concern  . Not on file  Social History Narrative  . Not on file    Past Surgical Hx:  Past Surgical History:  Procedure Laterality Date  . ABDOMINAL HYSTERECTOMY    . ENDOVENOUS ABLATION SAPHENOUS VEIN W/ LASER  02-29-2012   right small saphenous vein  by Gretta Began MD  . ENDOVENOUS ABLATION SAPHENOUS VEIN W/ LASER Left 04-11-2012   left greater saphenous by Gretta Began MD  . KNEE ARTHROSCOPY Left   . TOTAL KNEE ARTHROPLASTY Left 08/25/2013   Procedure: LEFT TOTAL KNEE ARTHROPLASTY;   Surgeon: Harvie Junior, MD;  Location: [facility] OR;  Service: Orthopedics;  Laterality: Left;  Marland Kitchen VEIN LIGATION AND STRIPPING  1978    Past Medical Hx:  Past Medical History:  Diagnosis Date  . Anemia   . Diabetes mellitus without complication (HCC)    fasting 110s  . Headache(784.0)   . Hypertension   . PONV (postoperative nausea and vomiting)   . Ulcer   . Varicose veins     Past Gynecological History: Gravida 4 para 3 hysterectomy in 1988 for menorrhagia from fibroids no history of abnormal Pap test normal spontaneous vaginal delivery x3 No LMP recorded. Patient has had  a hysterectomy.  Family Hx:  Family History  Problem Relation Age of Onset  . Cancer Father   . Diabetes Father     Vitals:  Blood pressure 115/65, pulse 63, temperature 98.6 F (37 C), temperature source Oral, resp. rate 20, height 5' 6.5" (1.689 m), weight 228 lb 4.8 oz (103.6 kg), SpO2 99 %.  Physical Exam: WD in NAD Neck  Supple NROM, without any enlargements.  Lymph Node Survey No cervical supraclavicular or inguinal adenopathy Cardiovascular  Pulse normal rate, regularity and rhythm. Lungs  Clear to auscultation bilaterally, Good air movement.  Skin  No rash/lesions/breakdown  Psychiatry  Alert and oriented appropriate mood affect speech and reasoning. Abdomen  Normoactive bowel sounds, abdomen soft, non-tender. Surgical  sites intact without evidence of hernia.  Back No CVA tenderness Genito Urinary Bartholin's glad with subtle nodularity at the base.  Non tender, no edema. Vulva/vagina     Bladder/urethra:  No lesions or masses  Vagina: atrophic no masses  Rectal  Good tone, no masses no cul de sac nodularity.  Extremities  No bilateral cyanosis, clubbing or edema.   

## 2018-02-14 ENCOUNTER — Telehealth: Payer: Self-pay | Admitting: *Deleted

## 2018-02-14 ENCOUNTER — Inpatient Hospital Stay: Payer: Medicare HMO | Attending: Gynecologic Oncology | Admitting: Gynecologic Oncology

## 2018-02-14 ENCOUNTER — Encounter: Payer: Self-pay | Admitting: Gynecologic Oncology

## 2018-02-14 VITALS — BP 115/65 | HR 63 | Temp 98.6°F | Resp 20 | Ht 66.5 in | Wt 228.3 lb

## 2018-02-14 DIAGNOSIS — N750 Cyst of Bartholin's gland: Secondary | ICD-10-CM

## 2018-02-14 NOTE — Patient Instructions (Signed)
Plan to have a bartholin's gland cyst marsupialization and biopsy at the [facility] on February 28, 2018 with Dr. Tawnya Crook.  You will receive a phone call from the pre-surgical RN to discuss instructions.  Please call for any questions or concerns.

## 2018-02-14 NOTE — Telephone Encounter (Signed)
Patient called and moved her post op appt on 2/10 to the morning

## 2018-02-22 ENCOUNTER — Other Ambulatory Visit: Payer: Self-pay

## 2018-02-22 ENCOUNTER — Encounter (HOSPITAL_BASED_OUTPATIENT_CLINIC_OR_DEPARTMENT_OTHER): Payer: Self-pay | Admitting: *Deleted

## 2018-02-22 NOTE — Progress Notes (Signed)
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

## 2018-02-28 ENCOUNTER — Encounter (HOSPITAL_BASED_OUTPATIENT_CLINIC_OR_DEPARTMENT_OTHER): Payer: Self-pay | Admitting: *Deleted

## 2018-02-28 ENCOUNTER — Ambulatory Visit (HOSPITAL_BASED_OUTPATIENT_CLINIC_OR_DEPARTMENT_OTHER): Payer: Medicare HMO | Admitting: Anesthesiology

## 2018-02-28 ENCOUNTER — Other Ambulatory Visit: Payer: Self-pay

## 2018-02-28 ENCOUNTER — Ambulatory Visit (HOSPITAL_BASED_OUTPATIENT_CLINIC_OR_DEPARTMENT_OTHER)
Admission: RE | Admit: 2018-02-28 | Discharge: 2018-02-28 | Disposition: A | Discharge status: Home / Self Care | Payer: Medicare HMO | Attending: Obstetrics | Admitting: Obstetrics

## 2018-02-28 ENCOUNTER — Encounter (HOSPITAL_BASED_OUTPATIENT_CLINIC_OR_DEPARTMENT_OTHER)
Admission: RE | Disposition: A | Discharge status: Home / Self Care | Payer: Self-pay | Source: Home / Self Care | Attending: Obstetrics

## 2018-02-28 DIAGNOSIS — Z79891 Long term (current) use of opiate analgesic: Secondary | ICD-10-CM | POA: Diagnosis not present

## 2018-02-28 DIAGNOSIS — Z7982 Long term (current) use of aspirin: Secondary | ICD-10-CM | POA: Insufficient documentation

## 2018-02-28 DIAGNOSIS — E119 Type 2 diabetes mellitus without complications: Secondary | ICD-10-CM | POA: Insufficient documentation

## 2018-02-28 DIAGNOSIS — Z791 Long term (current) use of non-steroidal anti-inflammatories (NSAID): Secondary | ICD-10-CM | POA: Insufficient documentation

## 2018-02-28 DIAGNOSIS — N750 Cyst of Bartholin's gland: Secondary | ICD-10-CM | POA: Diagnosis not present

## 2018-02-28 DIAGNOSIS — Z79899 Other long term (current) drug therapy: Secondary | ICD-10-CM | POA: Diagnosis not present

## 2018-02-28 DIAGNOSIS — I10 Essential (primary) hypertension: Secondary | ICD-10-CM | POA: Diagnosis not present

## 2018-02-28 HISTORY — DX: Cyst of Bartholin's gland: N75.0

## 2018-02-28 HISTORY — PX: BARTHOLIN CYST MARSUPIALIZATION: SHX5383

## 2018-02-28 LAB — CBC
HCT: 41.7 % (ref 36.0–46.0)
Hemoglobin: 12.9 g/dL (ref 12.0–15.0)
MCH: 24.7 pg — ABNORMAL LOW (ref 26.0–34.0)
MCHC: 30.9 g/dL (ref 30.0–36.0)
MCV: 79.7 fL — ABNORMAL LOW (ref 80.0–100.0)
Platelets: 196 10*3/uL (ref 150–400)
RBC: 5.23 MIL/uL — ABNORMAL HIGH (ref 3.87–5.11)
RDW: 16.4 % — ABNORMAL HIGH (ref 11.5–15.5)
WBC: 4.4 10*3/uL (ref 4.0–10.5)
nRBC: 0 % (ref 0.0–0.2)

## 2018-02-28 LAB — BASIC METABOLIC PANEL
Anion gap: 8 (ref 5–15)
BUN: 18 mg/dL (ref 8–23)
CO2: 25 mmol/L (ref 22–32)
Calcium: 9.3 mg/dL (ref 8.9–10.3)
Chloride: 104 mmol/L (ref 98–111)
Creatinine, Ser: 0.79 mg/dL (ref 0.44–1.00)
GFR calc Af Amer: >60 mL/min (ref >60)
GFR calc non Af Amer: >60 mL/min (ref >60)
Glucose, Bld: 123 mg/dL — ABNORMAL HIGH (ref 70–99)
Potassium: 3.6 mmol/L (ref 3.5–5.1)
Sodium: 137 mmol/L (ref 135–145)

## 2018-02-28 LAB — GLUCOSE, CAPILLARY: Glucose-Capillary: 115 mg/dL — ABNORMAL HIGH (ref 70–99)

## 2018-02-28 SURGERY — MARSUPIALIZATION, CYST, BARTHOLIN'S GLAND
Anesthesia: General | Site: Perineum | Laterality: Right

## 2018-02-28 MED ORDER — PROPOFOL 10 MG/ML IV BOLUS
INTRAVENOUS | Status: AC
  Filled 2018-02-28: qty 20

## 2018-02-28 MED ORDER — OXYCODONE HCL 5 MG PO TABS
5.0000 mg | ORAL_TABLET | Freq: Once | ORAL | Status: DC | PRN
  Filled 2018-02-28: qty 1

## 2018-02-28 MED ORDER — FENTANYL CITRATE (PF) 100 MCG/2ML IJ SOLN
25.0000 ug | INTRAMUSCULAR | Status: DC | PRN
  Filled 2018-02-28: qty 1

## 2018-02-28 MED ORDER — CEFAZOLIN SODIUM-DEXTROSE 2-4 GM/100ML-% IV SOLN
2.0000 g | INTRAVENOUS | Status: AC
  Administered 2018-02-28: 2 g via INTRAVENOUS
  Filled 2018-02-28: qty 100

## 2018-02-28 MED ORDER — LIDOCAINE 2% (20 MG/ML) 5 ML SYRINGE
INTRAMUSCULAR | Status: DC | PRN
  Administered 2018-02-28: 60 mg via INTRAVENOUS

## 2018-02-28 MED ORDER — LIDOCAINE 2% (20 MG/ML) 5 ML SYRINGE
INTRAMUSCULAR | Status: AC
  Filled 2018-02-28: qty 5

## 2018-02-28 MED ORDER — CEFAZOLIN SODIUM-DEXTROSE 2-4 GM/100ML-% IV SOLN
INTRAVENOUS | Status: AC
  Filled 2018-02-28: qty 100

## 2018-02-28 MED ORDER — FENTANYL CITRATE (PF) 100 MCG/2ML IJ SOLN
INTRAMUSCULAR | Status: DC | PRN
  Administered 2018-02-28 (×2): 25 ug via INTRAVENOUS
  Administered 2018-02-28: 50 ug via INTRAVENOUS

## 2018-02-28 MED ORDER — ONDANSETRON HCL 4 MG/2ML IJ SOLN
INTRAMUSCULAR | Status: DC | PRN
  Administered 2018-02-28: 4 mg via INTRAVENOUS

## 2018-02-28 MED ORDER — DEXAMETHASONE SODIUM PHOSPHATE 4 MG/ML IJ SOLN
INTRAMUSCULAR | Status: DC | PRN
  Administered 2018-02-28: 5 mg via INTRAVENOUS

## 2018-02-28 MED ORDER — DEXAMETHASONE SODIUM PHOSPHATE 10 MG/ML IJ SOLN
INTRAMUSCULAR | Status: AC
  Filled 2018-02-28: qty 1

## 2018-02-28 MED ORDER — PROPOFOL 10 MG/ML IV BOLUS
INTRAVENOUS | Status: DC | PRN
  Administered 2018-02-28: 50 mg via INTRAVENOUS
  Administered 2018-02-28: 150 mg via INTRAVENOUS

## 2018-02-28 MED ORDER — KETOROLAC TROMETHAMINE 15 MG/ML IJ SOLN
INTRAMUSCULAR | Status: DC | PRN
  Administered 2018-02-28: 15 mg via INTRAVENOUS

## 2018-02-28 MED ORDER — LACTATED RINGERS IV SOLN
INTRAVENOUS | Status: DC
  Administered 2018-02-28 (×2): via INTRAVENOUS
  Filled 2018-02-28: qty 1000

## 2018-02-28 MED ORDER — LIDOCAINE-EPINEPHRINE 1 %-1:100000 IJ SOLN
INTRAMUSCULAR | Status: DC | PRN
  Administered 2018-02-28: 10 mL

## 2018-02-28 MED ORDER — ONDANSETRON HCL 4 MG/2ML IJ SOLN
4.0000 mg | Freq: Four times a day (QID) | INTRAMUSCULAR | Status: DC | PRN
  Filled 2018-02-28: qty 2

## 2018-02-28 MED ORDER — OXYCODONE HCL 5 MG/5ML PO SOLN
5.0000 mg | Freq: Once | ORAL | Status: DC | PRN
  Filled 2018-02-28: qty 5

## 2018-02-28 MED ORDER — ONDANSETRON HCL 4 MG/2ML IJ SOLN
INTRAMUSCULAR | Status: AC
  Filled 2018-02-28: qty 2

## 2018-02-28 MED ORDER — EPHEDRINE SULFATE-NACL 50-0.9 MG/10ML-% IV SOSY
PREFILLED_SYRINGE | INTRAVENOUS | Status: DC | PRN
  Administered 2018-02-28: 10 mg via INTRAVENOUS

## 2018-02-28 MED ORDER — FENTANYL CITRATE (PF) 100 MCG/2ML IJ SOLN
INTRAMUSCULAR | Status: AC
  Filled 2018-02-28: qty 2

## 2018-02-28 MED ORDER — EPHEDRINE 5 MG/ML INJ
INTRAVENOUS | Status: AC
  Filled 2018-02-28: qty 10

## 2018-02-28 SURGICAL SUPPLY — 25 items
BLADE CLIPPER SURG (BLADE) IMPLANT
BLADE SURG 15 STRL LF DISP TIS (BLADE) ×1 IMPLANT
BLADE SURG 15 STRL SS (BLADE) ×2
CANISTER SUCT 3000ML PPV (MISCELLANEOUS) ×2 IMPLANT
CATH ROBINSON RED A/P 16FR (CATHETERS) IMPLANT
COVER WAND RF STERILE (DRAPES) ×2 IMPLANT
GAUZE 4X4 16PLY RFD (DISPOSABLE) ×2 IMPLANT
GLOVE BIO SURGEON STRL SZ 6.5 (GLOVE) ×3 IMPLANT
GLOVE BIO SURGEON STRL SZ7 (GLOVE) ×1 IMPLANT
GLOVE BIOGEL PI IND STRL 7.0 (GLOVE) ×1 IMPLANT
GLOVE BIOGEL PI INDICATOR 7.0 (GLOVE) ×1
KIT TURNOVER CYSTO (KITS) ×2 IMPLANT
NEEDLE HYPO 22GX1.5 SAFETY (NEEDLE) ×2 IMPLANT
NS IRRIG 500ML POUR BTL (IV SOLUTION) ×2 IMPLANT
PACK VAGINAL WOMENS (CUSTOM PROCEDURE TRAY) ×2 IMPLANT
PAD OB MATERNITY 4.3X12.25 (PERSONAL CARE ITEMS) ×2 IMPLANT
SUT SILK 3 0 PS 1 (SUTURE) ×1 IMPLANT
SUT VIC AB 0 CT1 36 (SUTURE) ×2 IMPLANT
SUT VIC AB 2-0 SH 27 (SUTURE)
SUT VIC AB 2-0 SH 27XBRD (SUTURE) IMPLANT
SUT VIC AB 3-0 SH 27 (SUTURE)
SUT VIC AB 3-0 SH 27X BRD (SUTURE) IMPLANT
SYR BULB IRRIGATION 50ML (SYRINGE) ×2 IMPLANT
TOWEL OR 17X24 6PK STRL BLUE (TOWEL DISPOSABLE) ×4 IMPLANT
WATER STERILE IRR 500ML POUR (IV SOLUTION) IMPLANT

## 2018-02-28 NOTE — Interval H&P Note (Signed)
History and Physical Interval Note:  02/28/2018 1:40 PM  Erica Buckley  has presented today for surgery, with the diagnosis of BARTHOLIN GLAND CYST  The various methods of treatment have been discussed with the patient and family. After consideration of risks, benefits and other options for treatment, the patient has consented to  Procedure(s): BARTHOLIN CYST MARSUPIALIZATION WITH POSSIBLE BIOPSY (Right) as a surgical intervention .  The patient's history has been reviewed, patient examined, no change in status, stable for surgery.  I have reviewed the patient's chart and labs.  Questions were answered to the patient's satisfaction.   Patient agrees to excision if indicated   Shonna Chock

## 2018-02-28 NOTE — Anesthesia Procedure Notes (Signed)
Procedure Name: LMA Insertion Date/Time: 02/28/2018 1:45 PM Performed by: Lewie Loron, MD Pre-anesthesia Checklist: Patient identified, Emergency Drugs available, Suction available and Patient being monitored Patient Re-evaluated:Patient Re-evaluated prior to induction Oxygen Delivery Method: Circle system utilized Preoxygenation: Pre-oxygenation with 100% oxygen Induction Type: IV induction Ventilation: Mask ventilation without difficulty LMA: LMA inserted LMA Size: 4.0 Number of attempts: 1 Airway Equipment and Method: Bite block Placement Confirmation: positive ETCO2 Tube secured with: Tape Dental Injury: Teeth and Oropharynx as per pre-operative assessment

## 2018-02-28 NOTE — Anesthesia Preprocedure Evaluation (Signed)
Anesthesia Evaluation  Patient identified by MRN, date of birth, ID band Patient awake    Reviewed: Allergy & Precautions, H&P , NPO status , Patient's Chart, lab work & pertinent test results  History of Anesthesia Complications (+) PONV and history of anesthetic complications  Airway Mallampati: II   Neck ROM: full    Dental   Pulmonary neg pulmonary ROS,    breath sounds clear to auscultation       Cardiovascular hypertension,  Rhythm:regular Rate:Normal     Neuro/Psych    GI/Hepatic   Endo/Other  diabetes, Type 2obese  Renal/GU      Musculoskeletal  (+) Arthritis ,   Abdominal   Peds  Hematology   Anesthesia Other Findings   Reproductive/Obstetrics                             Anesthesia Physical Anesthesia Plan  ASA: II  Anesthesia Plan: General   Post-op Pain Management:    Induction: Intravenous  PONV Risk Score and Plan: 4 or greater and Ondansetron, Dexamethasone, Scopolamine patch - Pre-op and Treatment may vary due to age or medical condition  Airway Management Planned: LMA  Additional Equipment:   Intra-op Plan:   Post-operative Plan:   Informed Consent: I have reviewed the patients History and Physical, chart, labs and discussed the procedure including the risks, benefits and alternatives for the proposed anesthesia with the patient or authorized representative who has indicated his/her understanding and acceptance.       Plan Discussed with: CRNA, Anesthesiologist and Surgeon  Anesthesia Plan Comments:         Anesthesia Quick Evaluation

## 2018-02-28 NOTE — Anesthesia Postprocedure Evaluation (Signed)
Anesthesia Post Note  Patient: Erica Buckley  Procedure(s) Performed: BARTHOLIN CYST MARSUPIALIZATION WITH POSSIBLE BIOPSY (Right Perineum)     Patient location during evaluation: PACU Anesthesia Type: General Level of consciousness: sedated and patient cooperative Pain management: pain level controlled Vital Signs Assessment: post-procedure vital signs reviewed and stable Respiratory status: spontaneous breathing Cardiovascular status: stable Anesthetic complications: no    Last Vitals:  Vitals:   02/28/18 1056 02/28/18 1456  BP: (!) 152/75 139/67  Pulse: 72 75  Resp: 18 19  Temp: (!) 36.3 C 36.8 C  SpO2: 98% 92%    Last Pain:  Vitals:   02/28/18 1456  TempSrc:   PainSc: 0-No pain                 Lewie Loron

## 2018-02-28 NOTE — Discharge Instructions (Signed)
Vulvectomy, Care After  °The vulva is the external female genitalia, outside and around the vagina and pubic bone. It consists of:  °The skin on, and in front of, the pubic bone.  °The clitoris.  °The labia majora (large lips) on the outside of the vagina.  °The labia minora (small lips) around the opening of the vagina.  °The opening and the skin in and around the vagina. °A vulvectomy is the removal of the tissue of the vulva, which sometimes includes removal of the lymph nodes and tissue in the groin areas.  °These discharge instructions provide you with general information on caring for yourself after you leave the hospital. It is also important that you know the warning signs of complications, so that you can seek treatment. Please read the instructions outlined below and refer to this sheet in the next few weeks. Your caregiver may also give you specific information and medicines. If you have any questions or complications after discharge, please call your caregiver.  °ACTIVITY  °Rest as much as possible the first two weeks after discharge.  °Arrange to have help from family or others with your daily activities when you go home.  °Avoid heavy lifting (more than 5 pounds), pushing, or pulling.  °If you feel tired, balance your activity with rest periods.  °Follow your caregiver's instruction about climbing stairs and driving a car.  °Increase activity gradually.  °Do not exercise until you have permission from your caregiver. °LEG AND FOOT CARE  °If your doctor has removed lymph nodes from your groin area, there may be an increase in swelling of your legs and feet. You can help prevent swelling by doing the following:  °Elevate your legs while sitting or lying down.  °If your caregiver has ordered special stockings, wear them according to instructions.  °Avoid standing in one place for long periods of time.  °Call the physical therapy department if you have any questions about swelling or treatment for  swelling.  °Avoid salt in your diet. It can cause fluid retention and swelling.  °Do not cross your legs, especially when sitting. °NUTRITION  °You may resume your normal diet.  °Drink 6 to 8 glasses of fluids a day.  °Eat a healthy, balanced diet including portions of food from the meat (protein), milk, fruit, vegetable, and bread groups.  °Your caregiver may recommend you take a multivitamin with iron. °ELIMINATION  °You may notice that your stream of urine is at a different angle, and may tend to spray. Using a plastic funnel may help to decrease urine spray.  °If constipation occurs, drink more liquids, and add more fruits, vegetables, and bran to your diet. You may take a mild laxative, such as Milk of Magnesia, Metamucil, or a stool softener such as Colace, with permission from your caregiver. °HYGIENE  °You may shower and wash your hair.  °Check with your caregiver about tub baths.  °Do not add any bath oils or chemicals to your bath water, after you have permission to take baths.  °Clean yourself well after moving your bowels.  °After urinating, wipe from top to bottom.  °A sitz bath will help keep your perineal area clean, reduce swelling, and provide comfort. °HOME CARE INSTRUCTIONS  °Take your temperature twice a day and record it, especially if you feel feverish or have chills.  °Follow your caregiver's instructions about medicines, activity, and follow-up appointments after surgery.  °Do not drink alcohol while taking pain medicine.  °Change your dressing as advised by   your caregiver.  °You may take over-the-counter medicine for pain, recommended by your caregiver.  °If your pain is not relieved with medicine, call your caregiver.  °Do not take aspirin because it can cause bleeding.  °Do not douche or use tampons (use a nonperfumed sanitary pad).  °Do not have sexual intercourse until your caregiver gives you permission. Hugging, kissing, and playful sexual activity is fine with your caregiver's  permission.  °Warm sitz baths, with your caregiver's permission, are helpful to control swelling and discomfort.  °Take showers instead of baths, until your caregiver gives you permission to take baths.  °You may take a mild medicine for constipation, recommended by your caregiver. Bran foods and drinking a lot of fluids will help with constipation.  °Make sure your family understands everything about your operation and recovery. °SEEK MEDICAL CARE IF:  °You notice swelling and redness around the wound area.  °You notice a foul smell coming from the wound or on the surgical dressing.  °You notice the wound is separating.  °You have painful or bloody urination.  °You develop nausea and vomiting.  °You develop diarrhea.  °You develop a rash.  °You have a reaction or allergy from the medicine.  °You feel dizzy or light-headed.  °You need stronger pain medicine. °SEEK IMMEDIATE MEDICAL CARE IF:  °You develop a temperature of 102° F (38.9° C) or higher.  °You pass out.  °You develop leg or chest pain.  °You develop abdominal pain.  °You develop shortness of breath.  °You develop bleeding from the wound area.  °You see pus in the wound area. °MAKE SURE YOU:  °Understand these instructions.  °Will watch your condition.  °Will get help right away if you are not doing well or get worse. °Document Released: 08/31/2003 Document Revised: 06/02/2013 Document Reviewed: 12/18/2008  °ExitCare® Patient Information ©2015 [employer]. This information is not intended to replace advice given to you by your health care provider. Make sure you discuss any questions you have with your health care provider.  ° ° °Post Anesthesia Home Care Instructions ° °Activity: °Get plenty of rest for the remainder of the day. A responsible adult should stay with you for 24 hours following the procedure.  °For the next 24 hours, DO NOT: °-Drive a car °-Operate machinery °-Drink alcoholic beverages °-Take any medication unless instructed by your  physician °-Make any legal decisions or sign important papers. ° °Meals: °Start with liquid foods such as gelatin or soup. Progress to regular foods as tolerated. Avoid greasy, spicy, heavy foods. If nausea and/or vomiting occur, drink only clear liquids until the nausea and/or vomiting subsides. Call your physician if vomiting continues. ° °Special Instructions/Symptoms: °Your throat may feel dry or sore from the anesthesia or the breathing tube placed in your throat during surgery. If this causes discomfort, gargle with warm salt water. The discomfort should disappear within 24 hours. ° °If you had a scopolamine patch placed behind your ear for the management of post- operative nausea and/or vomiting: ° °1. The medication in the patch is effective for 72 hours, after which it should be removed.  Wrap patch in a tissue and discard in the trash. Wash hands thoroughly with soap and water. °2. You may remove the patch earlier than 72 hours if you experience unpleasant side effects which may include dry mouth, dizziness or visual disturbances. °3. Avoid touching the patch. Wash your hands with soap and water after contact with the patch. °  ° ° ° °

## 2018-02-28 NOTE — Transfer of Care (Signed)
Immediate Anesthesia Transfer of Care Note  Patient: Erica Buckley  Procedure(s) Performed: BARTHOLIN CYST MARSUPIALIZATION WITH POSSIBLE BIOPSY (Right Perineum)  Patient Location: PACU  Anesthesia Type:General  Level of Consciousness: drowsy  Airway & Oxygen Therapy: Patient Spontanous Breathing and Patient connected to nasal cannula oxygen  Post-op Assessment: Report given to RN  Post vital signs: Reviewed and stable  Last Vitals:  Vitals Value Taken Time  BP 139/67 02/28/2018  2:53 PM  Temp    Pulse 72 02/28/2018  2:58 PM  Resp 18 02/28/2018  2:58 PM  SpO2 96 % 02/28/2018  2:58 PM  Vitals shown include unvalidated device data.  Last Pain:  Vitals:   02/28/18 1056  TempSrc: Oral         Complications: No apparent anesthesia complications

## 2018-02-28 NOTE — Op Note (Signed)
OPERATIVE REPORT 02/28/18    Preop Diagnosis: Recurrent Bartholin's Gland   Postoperative Diagnosis: Same  Surgery Performed: Partial simple vulvectomy/removal of Bartholin's Gland region  Surgeon:  Rolm BaptiseShawna L Bull Rendon Howell, MD  Assistant: none  Anesthesia: LMA  Estimated blood loss: 20 ml  Complications: None   Pathology: Right vulva at 7:00 c/w possible Bartholin's gland  Operative findings: Fullness at 7:00 at site of prior Bartholin's incision.  Procedure in Detail: The patient was identified in the preoperative holding area. Informed consent was signed on the chart. Patient was seen history was reviewed and exam was performed.   The patient was then taken to the operating room and placed in the supine position with SCD hose on. General anesthesia was then induced without difficulty. She was then placed in the dorsolithotomy position. The perineum was prepped with Betadine. The vagina was prepped with Betadine. The patient was then draped after the prep was dried.   Timeout was performed. The vulvar tissue was inspected for the Bartholin's gland. There was no obvious enlargement but there was fullness at the prior site of incisional drainage and the expected location of the Bartholin's gland. The skin was injected with local anesthesia. An elliptical incision was made over the anticipated gland site. The skin was elevated while metzenbaum scissors were used to undermine the surrounding vulvar dermal tissue. In some areas this plane developed easily and what appeared to likely be the gland became apparent. In other areas there was less defined plan and I had to palpate what seemed to be the edge of the lesion and separate that from the surrounding tissue. Given this I made the decision to excise rather than marsupialize. A finger was placed in the rectum to be sure no injury in the medio-inferior margin. Once safely away from this region the specimen was elevated with an Allis clamp and  then dissected free from the surrounding subcutaneous tissue. This was sent as vulva at 7:00. Hemostasis was obtained. Irrigation was used. The bed of the excision was reapproximated with 3-0 vicryl interrupted sutures. The overlying dermis was reapproximated with 3-0 vicryl interrupted suture. The remaining local anesthesia was injected.  All instrument, suture, laparotomy, Ray-Tec, and needle counts were correct x2. The patient tolerated the procedure well and was taken recovery room in stable condition.   Disposition: PACU in stable condition.

## 2018-03-01 ENCOUNTER — Encounter (HOSPITAL_BASED_OUTPATIENT_CLINIC_OR_DEPARTMENT_OTHER): Payer: Self-pay | Admitting: Obstetrics

## 2018-03-01 ENCOUNTER — Telehealth: Payer: Self-pay

## 2018-03-01 NOTE — Telephone Encounter (Signed)
Told Erica Buckley that that the pathology from her surgery showed no cancer per Warner Mccreedy, NP. She is doing well post op. Using Ibuprofen for the pain with good effect as well as the ice. Eating and drinking well. Has not moved bowels.  Will take a capful of Miralax to help stimulate her bowels as she feels as though she needs to moved them. Pt appreciated the call.

## 2018-03-10 DIAGNOSIS — R58 Hemorrhage, not elsewhere classified: Secondary | ICD-10-CM | POA: Diagnosis not present

## 2018-03-10 DIAGNOSIS — R1084 Generalized abdominal pain: Secondary | ICD-10-CM | POA: Diagnosis not present

## 2018-03-10 DIAGNOSIS — I10 Essential (primary) hypertension: Secondary | ICD-10-CM | POA: Diagnosis not present

## 2018-03-10 DIAGNOSIS — R42 Dizziness and giddiness: Secondary | ICD-10-CM | POA: Diagnosis not present

## 2018-03-11 ENCOUNTER — Emergency Department (HOSPITAL_COMMUNITY)
Admission: EM | Admit: 2018-03-11 | Discharge: 2018-03-11 | Disposition: A | Discharge status: Home / Self Care | Payer: Medicare HMO | Attending: Emergency Medicine | Admitting: Emergency Medicine

## 2018-03-11 ENCOUNTER — Inpatient Hospital Stay: Payer: Medicare HMO | Admitting: Obstetrics

## 2018-03-11 ENCOUNTER — Encounter (HOSPITAL_COMMUNITY): Payer: Self-pay | Admitting: *Deleted

## 2018-03-11 ENCOUNTER — Other Ambulatory Visit: Payer: Self-pay

## 2018-03-11 DIAGNOSIS — Z96652 Presence of left artificial knee joint: Secondary | ICD-10-CM | POA: Diagnosis not present

## 2018-03-11 DIAGNOSIS — I10 Essential (primary) hypertension: Secondary | ICD-10-CM | POA: Diagnosis not present

## 2018-03-11 DIAGNOSIS — N99820 Postprocedural hemorrhage and hematoma of a genitourinary system organ or structure following a genitourinary system procedure: Secondary | ICD-10-CM | POA: Diagnosis not present

## 2018-03-11 DIAGNOSIS — E119 Type 2 diabetes mellitus without complications: Secondary | ICD-10-CM | POA: Diagnosis not present

## 2018-03-11 DIAGNOSIS — L7622 Postprocedural hemorrhage and hematoma of skin and subcutaneous tissue following other procedure: Secondary | ICD-10-CM | POA: Insufficient documentation

## 2018-03-11 DIAGNOSIS — T148XXA Other injury of unspecified body region, initial encounter: Secondary | ICD-10-CM

## 2018-03-11 DIAGNOSIS — Z7984 Long term (current) use of oral hypoglycemic drugs: Secondary | ICD-10-CM | POA: Diagnosis not present

## 2018-03-11 DIAGNOSIS — Z79899 Other long term (current) drug therapy: Secondary | ICD-10-CM | POA: Insufficient documentation

## 2018-03-11 DIAGNOSIS — N939 Abnormal uterine and vaginal bleeding, unspecified: Secondary | ICD-10-CM | POA: Diagnosis present

## 2018-03-11 LAB — PROTIME-INR
INR: 1.05
Prothrombin Time: 13.6 seconds (ref 11.4–15.2)

## 2018-03-11 LAB — BASIC METABOLIC PANEL
Anion gap: 9 (ref 5–15)
BUN: 27 mg/dL — ABNORMAL HIGH (ref 8–23)
CO2: 23 mmol/L (ref 22–32)
Calcium: 8.9 mg/dL (ref 8.9–10.3)
Chloride: 108 mmol/L (ref 98–111)
Creatinine, Ser: 1.04 mg/dL — ABNORMAL HIGH (ref 0.44–1.00)
GFR calc non Af Amer: 52 mL/min — ABNORMAL LOW (ref >60)
Glucose, Bld: 164 mg/dL — ABNORMAL HIGH (ref 70–99)
Potassium: 3.4 mmol/L — ABNORMAL LOW (ref 3.5–5.1)
Sodium: 140 mmol/L (ref 135–145)

## 2018-03-11 LAB — CBC WITH DIFFERENTIAL/PLATELET
Abs Immature Granulocytes: 0.02 10*3/uL (ref 0.00–0.07)
BASOS ABS: 0 10*3/uL (ref 0.0–0.1)
BASOS PCT: 1 %
Eosinophils Absolute: 0.2 10*3/uL (ref 0.0–0.5)
Eosinophils Relative: 3 %
HCT: 39.6 % (ref 36.0–46.0)
Hemoglobin: 12.2 g/dL (ref 12.0–15.0)
Immature Granulocytes: 0 %
Lymphocytes Relative: 43 %
Lymphs Abs: 2.6 10*3/uL (ref 0.7–4.0)
MCH: 25 pg — ABNORMAL LOW (ref 26.0–34.0)
MCHC: 30.8 g/dL (ref 30.0–36.0)
MCV: 81.1 fL (ref 80.0–100.0)
Monocytes Absolute: 0.6 10*3/uL (ref 0.1–1.0)
Monocytes Relative: 11 %
NRBC: 0 % (ref 0.0–0.2)
Neutro Abs: 2.4 10*3/uL (ref 1.7–7.7)
Neutrophils Relative %: 42 %
Platelets: 174 10*3/uL (ref 150–400)
RBC: 4.88 MIL/uL (ref 3.87–5.11)
RDW: 16.4 % — ABNORMAL HIGH (ref 11.5–15.5)
WBC: 5.9 10*3/uL (ref 4.0–10.5)

## 2018-03-11 MED ORDER — LIDOCAINE HCL (PF) 1 % IJ SOLN: 20.0000 mL | Freq: Once | INTRAMUSCULAR | Status: DC

## 2018-03-11 MED ORDER — SILVER NITRATE-POT NITRATE 75-25 % EX MISC
CUTANEOUS | Status: AC
  Filled 2018-03-11: qty 4

## 2018-03-11 MED ORDER — IBUPROFEN 200 MG PO TABS
400.0000 mg | ORAL_TABLET | Freq: Once | ORAL | Status: DC
  Filled 2018-03-11: qty 2

## 2018-03-11 MED ORDER — ONDANSETRON 8 MG PO TBDP
8.0000 mg | ORAL_TABLET | Freq: Once | ORAL | Status: AC
  Administered 2018-03-11: 8 mg via ORAL
  Filled 2018-03-11: qty 1

## 2018-03-11 MED ORDER — SILVER NITRATE-POT NITRATE 75-25 % EX MISC
CUTANEOUS | Status: AC
  Administered 2018-03-11: 03:00:00
  Filled 2018-03-11: qty 1

## 2018-03-11 MED ORDER — LIDOCAINE HCL 1 % IJ SOLN
INTRAMUSCULAR | Status: AC
  Administered 2018-03-11: 10 mL
  Filled 2018-03-11: qty 20

## 2018-03-11 MED ORDER — POTASSIUM CHLORIDE CRYS ER 20 MEQ PO TBCR
40.0000 meq | EXTENDED_RELEASE_TABLET | Freq: Once | ORAL | Status: AC
  Administered 2018-03-11: 40 meq via ORAL
  Filled 2018-03-11: qty 2

## 2018-03-11 MED ORDER — ONDANSETRON HCL 4 MG PO TABS: 4.0000 mg | ORAL_TABLET | Freq: Four times a day (QID) | ORAL | 0 refills | Status: DC | PRN

## 2018-03-11 MED ORDER — TRAMADOL HCL 50 MG PO TABS
50.0000 mg | ORAL_TABLET | Freq: Once | ORAL | Status: AC
  Administered 2018-03-11: 50 mg via ORAL
  Filled 2018-03-11: qty 1

## 2018-03-11 NOTE — Consult Note (Signed)
Reason for Consult: Vaginal bleed after marsupialization of a right Bartholin's gland  Referring Physician: Dione Boozeavid Glick, MD  Erica Potashatherine L Buckley is an 78 y.o. female. She was BIBEMS w/vaginal bleeding s/p marsupialization of a right-sided Bartholin's gland on 1/30.  The bleeding started several hours ago--"gushing" with clots.  She also c/o nausea. HPI: See above.  The histology from the procedure was benign.  Past Medical History:  Diagnosis Date  . Anemia yrs ago  . Bartholin cyst   . Diabetes mellitus without complication (HCC)    fasting 110s type 2  . Hypertension   . PONV (postoperative nausea and vomiting)    nausea only  . Ulcer   . Varicose veins    both legs    Past Surgical History:  Procedure Laterality Date  . ABDOMINAL HYSTERECTOMY    . BARTHOLIN CYST MARSUPIALIZATION Right 02/28/2018   Procedure: BARTHOLIN CYST MARSUPIALIZATION WITH POSSIBLE BIOPSY;  Surgeon: Shonna ChockPhelps, Shawna B, MD;  [facility] [place];  Service: Gynecology;  Laterality: Right;  . CHOLECYSTECTOMY     About 10 years ago  . ENDOVENOUS ABLATION SAPHENOUS VEIN W/ LASER  02-29-2012   right small saphenous vein  by Gretta Beganodd Early MD  . ENDOVENOUS ABLATION SAPHENOUS VEIN W/ LASER Left 04-11-2012   left greater saphenous by Gretta Beganodd Early MD  . KNEE ARTHROSCOPY Left   . TOTAL KNEE ARTHROPLASTY Left 08/25/2013   Procedure: LEFT TOTAL KNEE ARTHROPLASTY;  Surgeon: Harvie JuniorJohn L Graves, MD;  Location: [facility];  Service: Orthopedics;  Laterality: Left;  Marland Kitchen. VEIN LIGATION AND STRIPPING  1978    Family History  Problem Relation Age of Onset  . Cancer Father   . Diabetes Father   . Pancreatic cancer Father   . Colon cancer Sister   . Diabetes Maternal Grandfather   . Heart disease Maternal Grandfather     Social History:  reports that she has never smoked. She has never used smokeless tobacco. She reports current alcohol use of about 1.0 standard drinks of alcohol per week. She reports that she does not use  drugs.  Allergies:  Allergies  Allergen Reactions  . Codeine Shortness Of Breath  . Other     No blood products, Luan Moorejehovah witness    Medications: I have reviewed the patient's current medications.  Results for orders placed or performed during the hospital encounter of 03/11/18 (from the past 48 hour(s))  Basic metabolic panel     Status: Abnormal   Collection Time: 03/11/18  1:12 AM  Result Value Ref Range   Sodium 140 135 - 145 mmol/L   Potassium 3.4 (L) 3.5 - 5.1 mmol/L   Chloride 108 98 - 111 mmol/L   CO2 23 22 - 32 mmol/L   Glucose, Bld 164 (H) 70 - 99 mg/dL   BUN 27 (H) 8 - 23 mg/dL   Creatinine, Ser 5.781.04 (H) 0.44 - 1.00 mg/dL   Calcium 8.9 8.9 - 46.910.3 mg/dL   GFR calc non Af Amer 52 (L) >60 mL/min   GFR calc Af Amer >60 >60 mL/min   Anion gap 9 5 - 15    Comment: Performed at [facility], [address] [place], [place] [address]  CBC with Differential     Status: Abnormal   Collection Time: 03/11/18  1:16 AM  Result Value Ref Range   WBC 5.9 4.0 - 10.5 K/uL   RBC 4.88 3.87 - 5.11 MIL/uL   Hemoglobin 12.2 12.0 - 15.0 g/dL   HCT  39.6 36.0 - 46.0 %   MCV 81.1 80.0 - 100.0 fL   MCH 25.0 (L) 26.0 - 34.0 pg   MCHC 30.8 30.0 - 36.0 g/dL   RDW 16.116.4 (H) 09.611.5 - 04.515.5 %   Platelets 174 150 - 400 K/uL   nRBC 0.0 0.0 - 0.2 %   Neutrophils Relative % 42 %   Neutro Abs 2.4 1.7 - 7.7 K/uL   Lymphocytes Relative 43 %   Lymphs Abs 2.6 0.7 - 4.0 K/uL   Monocytes Relative 11 %   Monocytes Absolute 0.6 0.1 - 1.0 K/uL   Eosinophils Relative 3 %   Eosinophils Absolute 0.2 0.0 - 0.5 K/uL   Basophils Relative 1 %   Basophils Absolute 0.0 0.0 - 0.1 K/uL   Immature Granulocytes 0 %   Abs Immature Granulocytes 0.02 0.00 - 0.07 K/uL   Reactive, Benign Lymphocytes PRESENT     Comment: Performed at [facility], [address] [place], [place] [address]  Protime-INR     Status: None   Collection Time: 03/11/18  1:16 AM  Result Value Ref Range    Prothrombin Time 13.6 11.4 - 15.2 seconds   INR 1.05     Comment: Performed at [facility], [address] [place], [place] [address]    No results found.  Review of Systems  Constitutional: Negative.   Cardiovascular: Negative.   Gastrointestinal: Positive for nausea.  Genitourinary:       Vaginal bleeding   Blood pressure 110/62, pulse 67, temperature 97.8 F (36.6 C), temperature source Oral, resp. rate 18, height 5\' 7"  (1.702 m), weight 103.4 kg, SpO2 97 %. Physical Exam  Constitutional: She appears well-developed and well-nourished. No distress.  Neck: Neck supple.  Genitourinary:      Procedure note: The vagina was prepped with Betadine.  The cyst bed was infiltrated with 1% lidocaine plain.  Silver nitrate was applied to the cyst bed.  Several figure-of-eight 2-0 Vicryl sutures were placed.  Adequate hemostasis was noted.  Assessment/Plan: Recent marsupialization of a right-sided Bartholin's gland s/p a bleed from the cyst bed.  No obvious hematoma.  No further bleeding at present s/p placement of hemostatic sutures  >monitor PV loss >OK for D/C home if remains w/mininal bleeding >Return prn; follow-up in office in a few days  Erica Buckley 03/11/2018, 2:31 AM

## 2018-03-11 NOTE — Discharge Instructions (Addendum)
Return if you are having any problems. 

## 2018-03-11 NOTE — ED Provider Notes (Signed)
[facility]-EMERGENCY DEPT Provider Note   CSN: [id number] Arrival date & time: 03/11/18  0012     History   Chief Complaint Chief Complaint  Patient presents with  . Vaginal Bleeding    HPI Erica Buckley is a 78 y.o. female.  The history is provided by the patient.  She has history of diabetes, hypertension, Bartholin cyst and comes in with vaginal bleeding which started tonight about 2 hours ago.  She has had significant clots.  Of note, she had excision of a Bartholin's cyst on January 30.  She is status post hysterectomy.  She is not on any anticoagulants or antiplatelet agents but does take occasional ibuprofen.  Past Medical History:  Diagnosis Date  . Anemia yrs ago  . Bartholin cyst   . Diabetes mellitus without complication (HCC)    fasting 110s type 2  . Hypertension   . PONV (postoperative nausea and vomiting)    nausea only  . Ulcer   . Varicose veins    both legs    Patient Active Problem List   Diagnosis Date Noted  . Type II or unspecified type diabetes mellitus without mention of complication, not stated as uncontrolled 09/02/2013  . Osteoarthritis of left knee S/P Left total knee arthroplasty 08/29/2013  . Hypertension 08/29/2013  . Type II or unspecified type diabetes mellitus without mention of complication, uncontrolled 08/29/2013  . Unspecified constipation 08/29/2013  . Postoperative anemia due to acute blood loss 08/28/2013  . Total knee replacement status 08/25/2013  . Pain in limb- Right leg 08/01/2012  . Aftercare following surgery of the circulatory system, NEC 03/07/2012  . Varicose veins of lower extremities with ulcer and inflammation (HCC) 02/29/2012  . Unspecified venous (peripheral) insufficiency 02/29/2012  . Varicose veins of lower extremities with other complications 10/30/2011    Past Surgical History:  Procedure Laterality Date  . ABDOMINAL HYSTERECTOMY    . BARTHOLIN CYST MARSUPIALIZATION Right  02/28/2018   Procedure: BARTHOLIN CYST MARSUPIALIZATION WITH POSSIBLE BIOPSY;  Surgeon: Shonna ChockPhelps, Shawna B, MD;  [facility] [place];  Service: Gynecology;  Laterality: Right;  . CHOLECYSTECTOMY     About 10 years ago  . ENDOVENOUS ABLATION SAPHENOUS VEIN W/ LASER  02-29-2012   right small saphenous vein  by Gretta Beganodd Early MD  . ENDOVENOUS ABLATION SAPHENOUS VEIN W/ LASER Left 04-11-2012   left greater saphenous by Gretta Beganodd Early MD  . KNEE ARTHROSCOPY Left   . TOTAL KNEE ARTHROPLASTY Left 08/25/2013   Procedure: LEFT TOTAL KNEE ARTHROPLASTY;  Surgeon: Harvie JuniorJohn L Graves, MD;  Location: [facility] OR;  Service: Orthopedics;  Laterality: Left;  Marland Kitchen. VEIN LIGATION AND STRIPPING  1978     OB History   No obstetric history on file.      Home Medications    Prior to Admission medications   Medication Sig Start Date End Date Taking? Authorizing Provider  B Complex-C (B-COMPLEX WITH VITAMIN C) tablet Take 1 tablet by mouth as needed.     [provider]  diclofenac sodium (VOLTAREN) 1 % GEL Apply 2 g topically 2 (two) times daily.    [provider]  empagliflozin (JARDIANCE) 10 MG TABS tablet Take 10 mg by mouth daily.    [provider]  ibuprofen (ADVIL,MOTRIN) 800 MG tablet Take 800 mg by mouth every 8 (eight) hours as needed for moderate pain.    [provider]  losartan-hydrochlorothiazide (HYZAAR) 50-12.5 MG per tablet Take 1 tablet by mouth daily.  [provider]  metoprolol succinate (TOPROL-XL) 25 MG 24 hr tablet Take 25 mg by mouth daily.    [provider]    Family History Family History  Problem Relation Age of Onset  . Cancer Father   . Diabetes Father   . Pancreatic cancer Father   . Colon cancer Sister   . Diabetes Maternal Grandfather   . Heart disease Maternal Grandfather     Social History Social History   Tobacco Use  . Smoking status: Never Smoker  . Smokeless tobacco: Never Used  Substance Use Topics  .  Alcohol use: Yes    Alcohol/week: 1.0 standard drinks    Types: 1 Glasses of wine per week    Comment: occassionally  . Drug use: No     Allergies   Codeine and Other   Review of Systems Review of Systems  All other systems reviewed and are negative.    Physical Exam Updated Vital Signs BP 110/62 (BP Location: Left Arm)   Pulse 67   Temp 97.8 F (36.6 C) (Oral)   Resp 18   Ht 5\' 7"  (1.702 m)   Wt 103.4 kg   SpO2 97%   BMI 35.71 kg/m   Physical Exam Vitals signs and nursing note reviewed.    78 year old female, resting comfortably and in no acute distress. Vital signs are normal. Oxygen saturation is 97%, which is normal. Head is normocephalic and atraumatic. PERRLA, EOMI. Oropharynx is clear. Neck is nontender and supple without adenopathy or JVD. Back is nontender and there is no CVA tenderness. Lungs are clear without rales, wheezes, or rhonchi. Chest is nontender. Heart has regular rate and rhythm without murmur. Abdomen is soft, flat, nontender without masses or hepatosplenomegaly and peristalsis is normoactive. Pelvic: Surgical site from right sided Bartholin cyst removal is identified and there is steady stream of blood coming from this area.  Attempts to control bleeding with direct pressure were unsuccessful and gauze packing was placed. Extremities have no cyanosis or edema, full range of motion is present. Skin is warm and dry without rash. Neurologic: Mental status is normal, cranial nerves are intact, there are no motor or sensory deficits.  ED Treatments / Results  Labs (all labs ordered are listed, but only abnormal results are displayed) Labs Reviewed  BASIC METABOLIC PANEL - Abnormal; Notable for the following components:      Result Value   Potassium 3.4 (*)    Glucose, Bld 164 (*)    BUN 27 (*)    Creatinine, Ser 1.04 (*)    GFR calc non Af Amer 52 (*)    All other components within normal limits  CBC WITH DIFFERENTIAL/PLATELET - Abnormal;  Notable for the following components:   MCH 25.0 (*)    RDW 16.4 (*)    All other components within normal limits  PROTIME-INR    Procedures Procedures   Medications Ordered in ED Medications  lidocaine (PF) (XYLOCAINE) 1 % injection 20 mL (20 mLs Infiltration Not Given 03/11/18 0234)  silver nitrate applicators 75-25 % applicator (has no administration in time range)  traMADol (ULTRAM) tablet 50 mg (has no administration in time range)  lidocaine (XYLOCAINE) 1 % (with pres) injection (10 mLs  Given by Other 03/11/18 0234)  silver nitrate applicators 75-25 % applicator (  Given by Other 03/11/18 0235)  potassium chloride SA (K-DUR,KLOR-CON) CR tablet 40 mEq (40 mEq Oral Given 03/11/18 0321)  ondansetron (ZOFRAN-ODT) disintegrating tablet 8 mg (8 mg Oral  Given 03/11/18 0527)     Initial Impression / Assessment and Plan / ED Course  I have reviewed the triage vital signs and the nursing notes.  Pertinent lab results that were available during my care of the patient were reviewed by me and considered in my medical decision making (see chart for details).  Bleeding from surgical site where right-sided Bartholin cyst was excised.  Case has been discussed with Dr. Andrey Farmer, on-call for Dr. Doroteo Glassman, who is sending her nurse to evaluate the patient to see if she will need to go to the OR to get hemostasis.  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  Old records are reviewed confirming Bartholin cyst removal on January 30.  Patient was seen by Dr. Liston Alba who has placed deep hemostatic sutures.  Bleeding has been controlled and patient is safe for discharge.  Advised to continue routine wound care.  Final Clinical Impressions(s) / ED Diagnoses   Final diagnoses:  Bleeding from wound    ED Discharge Orders         Ordered    ondansetron (ZOFRAN) 4 MG tablet  Every 6 hours PRN     03/11/18 0526           Dione Booze, MD 03/11/18 0532

## 2018-03-11 NOTE — ED Notes (Signed)
Bed: DC30 Expected date:  Expected time:  Means of arrival:  Comments: EMS 78 yo female from home vaginal bleeding-cyst removed

## 2018-03-11 NOTE — ED Triage Notes (Signed)
Pt bib EMS and presents with vaginal bleeding.  Pt had a vaginal cyst removed on 02/28/2018.  After the procedure, pt bled for 5 days. The bleeding tapered off and then 15 minutes prior to EMS arrival to her home, pt soaked two pads and filled up the toilet bowl with blood. Pt reported abd cramping prior to a gush of blood.  Pt reports feeling dizzy on arrival to ED.  Pt endorses nausea but no emesis.

## 2018-03-13 ENCOUNTER — Encounter: Payer: Self-pay | Admitting: Obstetrics

## 2018-03-13 ENCOUNTER — Inpatient Hospital Stay: Payer: Medicare HMO | Attending: Gynecologic Oncology | Admitting: Obstetrics

## 2018-03-13 VITALS — BP 122/50 | HR 62 | Temp 97.8°F | Resp 20 | Ht 67.0 in | Wt 228.0 lb

## 2018-03-13 DIAGNOSIS — Z9889 Other specified postprocedural states: Secondary | ICD-10-CM

## 2018-03-13 DIAGNOSIS — N750 Cyst of Bartholin's gland: Secondary | ICD-10-CM

## 2018-03-13 MED ORDER — TRAMADOL HCL 50 MG PO TABS: 50.0000 mg | ORAL_TABLET | Freq: Four times a day (QID) | ORAL | 0 refills | Status: DC | PRN

## 2018-03-13 NOTE — Patient Instructions (Signed)
1. Return to work 04/15/18 2. If any further bleeding or concerns call for followup 3. Start Sitz baths or simply soaking in bath with a small amount of Epsom salt once daily. 4. Hold off on taking ibuprofen or equivalent for 2 more weeks. Hold any aspirin for 2 weeks. 5. Refill for Tramadol will be ordered.

## 2018-03-13 NOTE — Progress Notes (Signed)
S: Patient is postop from Bartholin's Gland Excision 02/28/18. Final pathology showed benign squamous lined tissue c/w Bartholin gland cyst. She had bleeding ~POD11 from the area and went to ER. A packing was placed that she states "tore" her stitches and then we were called to repair the site. Dr. Tamela Oddi was asked to come in and she placed sutures. Since that time no issues.  O:  Vitals:   03/13/18 1451  BP: (!) 122/50  Pulse: 62  Resp: 20  Temp: 97.8 F (36.6 C)  SpO2: 98%   Gen NAD Resp unlabored Abdo soft, NTND Vulva - wound CDI and no bleeding noted. Non-tender, no erythema.  A/P Postop Wound care discussed Hold on NSAIDS for ~2 weeks Activity restrictions and wait for return to work until ~6 weeks postop Followup if any other concerns, otherwise we will discharge back to Dr. Dion Body for continued well-woman care.  Cc: Geryl Rankins, MD (Ref Ob/Gyn) Laurann Montana, MD (PCP)

## 2018-04-22 ENCOUNTER — Telehealth: Payer: Medicare HMO | Admitting: Physician Assistant

## 2018-04-22 DIAGNOSIS — J069 Acute upper respiratory infection, unspecified: Secondary | ICD-10-CM | POA: Diagnosis not present

## 2018-04-22 DIAGNOSIS — R0602 Shortness of breath: Secondary | ICD-10-CM

## 2018-04-22 DIAGNOSIS — R05 Cough: Secondary | ICD-10-CM | POA: Diagnosis not present

## 2018-04-22 NOTE — Progress Notes (Signed)
Based on what you shared with me, I feel your condition warrants further evaluation and I recommend that you be seen for a face to face office visit.  Giving recent surgery along with the cough and shortness of breath, you will need in-person evaluation today at Urgent Care or ER as you are at a higher risk for pneumonia due to being less active (surgery) and more worrisome things like pulmonary embolus.   NOTE: If you entered your credit card information for this eVisit, you will not be charged. You may see a "hold" on your card for the $35 but that hold will drop off and you will not have a charge processed.  If you are having a true medical emergency please call 911.  If you need an urgent face to face visit, [place] has four urgent care centers for your convenience.    PLEASE NOTE: THE [facility]ACARE LOCATIONS AND URGENT CARE CLINICS DO NOT HAVE THE TESTING FOR CORONAVIRUS COVID19 AVAILABLE.  IF YOU FEEL YOU NEED THIS TEST YOU MUST HAVE AN ORDER TO GO TO A TESTING LOCATION FROM YOUR PROVIDER OR FROM A SCREENING E-VISIT     [email or web address] to reserve your spot online an avoid wait times  [facility] [address], [address] [place], [place] [address] 8 am to 8 pm Monday-Friday 10 am to 4 pm Saturday-Sunday *Across the street from [employer]  [address] [place], [address] 8 am to 5 pm Monday-Friday * In the [facility] on the [facility]   The following sites will take your insurance:  . [facility]  [phone] Get Driving Directions Find a Provider at this Location  [address] [place], [place] [address] . 10 am to 8 pm Monday-Friday . 12 pm to 8 pm Saturday-Sunday   . [facility] Urgent Care at [facility]  [phone] Get Driving Directions Find a Provider at this Location  [address], [address] [place], [place] [address] . 8 am to 8 pm Monday-Friday . 9 am to  6 pm Saturday . 11 am to 6 pm Sunday   . [facility] Health Urgent Care at [facility]  [phone] Get Driving Directions  [address] [place], [place] [address] . 8 am to 8 pm Monday-Friday . 8 am to 4 pm Saturday-Sunday   Your e-visit answers were reviewed by a board certified advanced clinical practitioner to complete your personal care plan.  Thank you for using e-Visits.

## 2018-06-07 DIAGNOSIS — Z20828 Contact with and (suspected) exposure to other viral communicable diseases: Secondary | ICD-10-CM | POA: Diagnosis not present

## 2018-06-17 DIAGNOSIS — R05 Cough: Secondary | ICD-10-CM | POA: Diagnosis not present

## 2018-06-17 DIAGNOSIS — Z7189 Other specified counseling: Secondary | ICD-10-CM | POA: Diagnosis not present

## 2018-07-08 DIAGNOSIS — M62838 Other muscle spasm: Secondary | ICD-10-CM | POA: Diagnosis not present

## 2018-07-08 DIAGNOSIS — R42 Dizziness and giddiness: Secondary | ICD-10-CM | POA: Diagnosis not present

## 2018-07-08 DIAGNOSIS — I129 Hypertensive chronic kidney disease with stage 1 through stage 4 chronic kidney disease, or unspecified chronic kidney disease: Secondary | ICD-10-CM | POA: Diagnosis not present

## 2018-07-08 DIAGNOSIS — N182 Chronic kidney disease, stage 2 (mild): Secondary | ICD-10-CM | POA: Diagnosis not present

## 2018-07-08 DIAGNOSIS — E1121 Type 2 diabetes mellitus with diabetic nephropathy: Secondary | ICD-10-CM | POA: Diagnosis not present

## 2018-07-09 DIAGNOSIS — E1121 Type 2 diabetes mellitus with diabetic nephropathy: Secondary | ICD-10-CM | POA: Diagnosis not present

## 2018-07-12 DIAGNOSIS — S134XXD Sprain of ligaments of cervical spine, subsequent encounter: Secondary | ICD-10-CM | POA: Diagnosis not present

## 2018-07-16 DIAGNOSIS — S134XXD Sprain of ligaments of cervical spine, subsequent encounter: Secondary | ICD-10-CM | POA: Diagnosis not present

## 2018-07-19 DIAGNOSIS — S134XXD Sprain of ligaments of cervical spine, subsequent encounter: Secondary | ICD-10-CM | POA: Diagnosis not present

## 2018-07-23 DIAGNOSIS — S134XXD Sprain of ligaments of cervical spine, subsequent encounter: Secondary | ICD-10-CM | POA: Diagnosis not present

## 2018-07-25 DIAGNOSIS — S134XXD Sprain of ligaments of cervical spine, subsequent encounter: Secondary | ICD-10-CM | POA: Diagnosis not present

## 2018-07-30 DIAGNOSIS — S134XXD Sprain of ligaments of cervical spine, subsequent encounter: Secondary | ICD-10-CM | POA: Diagnosis not present

## 2018-08-01 DIAGNOSIS — S134XXD Sprain of ligaments of cervical spine, subsequent encounter: Secondary | ICD-10-CM | POA: Diagnosis not present

## 2018-08-06 DIAGNOSIS — S134XXD Sprain of ligaments of cervical spine, subsequent encounter: Secondary | ICD-10-CM | POA: Diagnosis not present

## 2018-08-09 DIAGNOSIS — S134XXD Sprain of ligaments of cervical spine, subsequent encounter: Secondary | ICD-10-CM | POA: Diagnosis not present

## 2018-08-14 DIAGNOSIS — E119 Type 2 diabetes mellitus without complications: Secondary | ICD-10-CM | POA: Diagnosis not present

## 2018-08-14 DIAGNOSIS — H524 Presbyopia: Secondary | ICD-10-CM | POA: Diagnosis not present

## 2018-08-19 DIAGNOSIS — E119 Type 2 diabetes mellitus without complications: Secondary | ICD-10-CM | POA: Diagnosis not present

## 2018-10-01 DIAGNOSIS — M62838 Other muscle spasm: Secondary | ICD-10-CM | POA: Diagnosis not present

## 2018-10-01 DIAGNOSIS — Z23 Encounter for immunization: Secondary | ICD-10-CM | POA: Diagnosis not present

## 2018-10-01 DIAGNOSIS — M25512 Pain in left shoulder: Secondary | ICD-10-CM | POA: Diagnosis not present

## 2018-10-11 DIAGNOSIS — M542 Cervicalgia: Secondary | ICD-10-CM | POA: Diagnosis not present

## 2018-10-30 ENCOUNTER — Other Ambulatory Visit: Payer: Self-pay | Admitting: *Deleted

## 2018-10-30 NOTE — Patient Outreach (Signed)
[facility]) Care Management  10/30/2018  Erica Buckley 1940-04-20 [id number]   Referral Date: 10/29/2018 Referral Source: Insurance Referral Reason: Insurance: [place] attempt # 1, successful.  Social:  Lives alone, has daughter that lives locally but able to perform all ADL's/IADL's independently.  Also has grandchildren that are able to help with management of care.  Report she reached out to [facility] because she has a friend that was receiving some benefits that she wanted to receive.  Her friend has started getting a food card that is she will have re-loaded every quarter.  State when she called, she was told that she didn't qualify for this.  She realizes now that all [facility] plans are not the same.  She has now been approved for Medicaid and is aware that this will open her options, but she can't switch her coverage until open enrollment time (in the next 2 weeks).    This care manager inquired about assistance for food as this was her original request.  She denies stating she was only inquiring about the service.  She is able to wait until open enrollment for additional benefits.  Conditions: Per chart, has history of venous insufficiency, hypertension, diabetes, and left total knee replacement.  Medications: Reviewed, denies any issues with management, but does report trouble paying for Jardiance.  State she will start receiving Extra help but is unclear of all the specifics.  Appointments:  Report she is up to date with PCP visits, confirms Dr. Dema Severin as primary MD.  Provides her own transportation to MD appointments.   Plan: RN CM will place referrals for [facility] pharmacist and disease management for diabetes.  Valente David, [place], MSN [place] [phone]

## 2018-10-31 ENCOUNTER — Other Ambulatory Visit: Payer: Self-pay | Admitting: Pharmacist

## 2018-10-31 NOTE — Patient Outreach (Signed)
[facility]) Care Management [place]  10/31/2018  Karlie Aung Hostetler Erica Buckley, Erica Buckley [id number]  Reason for referral: medication questions on Extra Help LIS program  Patient's PCP if Dr. Harlan Stains at [place].  PCP has PharmD Vanessa Kick in office with her and who is contracted with [facility] to receive all CM referrals for Dr. Dema Severin.  Phone call placed to pharmacist to transfer referral. Provided my contact information if pharmacist needs to reach out to me.    Plan: -Will close [facility] pharmacy case  Ralene Bathe, PharmD, [place] [phone]

## 2018-11-01 DIAGNOSIS — M542 Cervicalgia: Secondary | ICD-10-CM | POA: Diagnosis not present

## 2018-11-04 ENCOUNTER — Other Ambulatory Visit: Payer: Self-pay

## 2018-11-04 DIAGNOSIS — M5412 Radiculopathy, cervical region: Secondary | ICD-10-CM | POA: Diagnosis not present

## 2018-11-04 NOTE — Patient Outreach (Addendum)
[facility]) Care Management  11/04/2018  Erica Buckley [birth year] [id number]   Referral Date: 10/31/2018 Referral Source: Care coordinator Referral Reason: Disease management diabetes   Outreach Attempt: Telephone call to patient. She states she is in the grocery store and asked CM to call her back later today. Advised CM would attempt to call her back later today.      Plan: RN CM will attempt to call patient back later today if not will attempt again later in the week.    Update: Telephone call back to patient.  No answer.  HIPAA compliant voice message left.    Plan: RN CM will send letter and attempt again within the next month.    Jone Baseman, RN, MSN [facility] Care Management Care Management Coordinator Direct Line [phone] Toll Free: [phone]  Fax: [phone]

## 2018-11-05 ENCOUNTER — Other Ambulatory Visit: Payer: Self-pay | Admitting: Family Medicine

## 2018-11-05 DIAGNOSIS — Z1231 Encounter for screening mammogram for malignant neoplasm of breast: Secondary | ICD-10-CM

## 2018-11-07 DIAGNOSIS — M4802 Spinal stenosis, cervical region: Secondary | ICD-10-CM | POA: Diagnosis not present

## 2018-11-07 DIAGNOSIS — M5082 Other cervical disc disorders, mid-cervical region, unspecified level: Secondary | ICD-10-CM | POA: Diagnosis not present

## 2018-11-11 ENCOUNTER — Other Ambulatory Visit: Payer: Self-pay

## 2018-11-11 DIAGNOSIS — M4802 Spinal stenosis, cervical region: Secondary | ICD-10-CM | POA: Diagnosis not present

## 2018-11-11 DIAGNOSIS — M5082 Other cervical disc disorders, mid-cervical region, unspecified level: Secondary | ICD-10-CM | POA: Diagnosis not present
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
## 2018-11-13 DIAGNOSIS — M5082 Other cervical disc disorders, mid-cervical region, unspecified level: Secondary | ICD-10-CM | POA: Diagnosis not present

## 2018-11-13 DIAGNOSIS — M4802 Spinal stenosis, cervical region: Secondary | ICD-10-CM | POA: Diagnosis not present

## 2018-11-14 DIAGNOSIS — M4802 Spinal stenosis, cervical region: Secondary | ICD-10-CM | POA: Diagnosis not present

## 2018-11-14 DIAGNOSIS — M5082 Other cervical disc disorders, mid-cervical region, unspecified level: Secondary | ICD-10-CM | POA: Diagnosis not present

## 2018-11-14 DIAGNOSIS — M65341 Trigger finger, right ring finger: Secondary | ICD-10-CM | POA: Diagnosis not present

## 2018-11-18 DIAGNOSIS — M4802 Spinal stenosis, cervical region: Secondary | ICD-10-CM | POA: Diagnosis not present

## 2018-11-18 DIAGNOSIS — M5082 Other cervical disc disorders, mid-cervical region, unspecified level: Secondary | ICD-10-CM | POA: Diagnosis not present

## 2018-11-22 DIAGNOSIS — I129 Hypertensive chronic kidney disease with stage 1 through stage 4 chronic kidney disease, or unspecified chronic kidney disease: Secondary | ICD-10-CM | POA: Diagnosis not present

## 2018-11-22 DIAGNOSIS — R42 Dizziness and giddiness: Secondary | ICD-10-CM | POA: Diagnosis not present

## 2018-11-22 DIAGNOSIS — H93A9 Pulsatile tinnitus, unspecified ear: Secondary | ICD-10-CM | POA: Diagnosis not present

## 2018-11-25 ENCOUNTER — Other Ambulatory Visit: Payer: Self-pay | Admitting: Family Medicine

## 2018-11-25 DIAGNOSIS — H93A9 Pulsatile tinnitus, unspecified ear: Secondary | ICD-10-CM

## 2018-12-02 ENCOUNTER — Other Ambulatory Visit: Payer: Medicare HMO

## 2018-12-09 ENCOUNTER — Other Ambulatory Visit: Payer: Medicare HMO

## 2018-12-09 DIAGNOSIS — M542 Cervicalgia: Secondary | ICD-10-CM | POA: Diagnosis not present

## 2018-12-10 ENCOUNTER — Ambulatory Visit
Admission: RE | Admit: 2018-12-10 | Discharge: 2018-12-10 | Disposition: A | Discharge status: Home / Self Care | Payer: Medicare HMO | Source: Ambulatory Visit | Attending: Family Medicine | Admitting: Family Medicine

## 2018-12-10 DIAGNOSIS — E1121 Type 2 diabetes mellitus with diabetic nephropathy: Secondary | ICD-10-CM | POA: Diagnosis not present

## 2018-12-10 DIAGNOSIS — I129 Hypertensive chronic kidney disease with stage 1 through stage 4 chronic kidney disease, or unspecified chronic kidney disease: Secondary | ICD-10-CM | POA: Diagnosis not present

## 2018-12-10 DIAGNOSIS — H93A9 Pulsatile tinnitus, unspecified ear: Secondary | ICD-10-CM

## 2018-12-10 DIAGNOSIS — I6522 Occlusion and stenosis of left carotid artery: Secondary | ICD-10-CM | POA: Diagnosis not present

## 2018-12-10 DIAGNOSIS — N182 Chronic kidney disease, stage 2 (mild): Secondary | ICD-10-CM | POA: Diagnosis not present

## 2018-12-10 DIAGNOSIS — E1122 Type 2 diabetes mellitus with diabetic chronic kidney disease: Secondary | ICD-10-CM | POA: Diagnosis not present

## 2018-12-17 DIAGNOSIS — M65341 Trigger finger, right ring finger: Secondary | ICD-10-CM | POA: Diagnosis not present

## 2018-12-20 ENCOUNTER — Ambulatory Visit
Admission: RE | Admit: 2018-12-20 | Discharge: 2018-12-20 | Disposition: A | Discharge status: Home / Self Care | Payer: Medicare HMO | Source: Ambulatory Visit | Attending: Family Medicine | Admitting: Family Medicine

## 2018-12-20 ENCOUNTER — Other Ambulatory Visit: Payer: Self-pay

## 2018-12-20 DIAGNOSIS — Z1231 Encounter for screening mammogram for malignant neoplasm of breast: Secondary | ICD-10-CM

## 2018-12-25 ENCOUNTER — Other Ambulatory Visit: Payer: Self-pay | Admitting: Family Medicine

## 2018-12-25 DIAGNOSIS — R928 Other abnormal and inconclusive findings on diagnostic imaging of breast: Secondary | ICD-10-CM

## 2019-01-01 ENCOUNTER — Other Ambulatory Visit: Payer: Self-pay

## 2019-01-01 ENCOUNTER — Ambulatory Visit
Admission: RE | Admit: 2019-01-01 | Discharge: 2019-01-01 | Disposition: A | Discharge status: Home / Self Care | Payer: Medicare HMO | Source: Ambulatory Visit | Attending: Family Medicine | Admitting: Family Medicine

## 2019-01-01 DIAGNOSIS — R928 Other abnormal and inconclusive findings on diagnostic imaging of breast: Secondary | ICD-10-CM

## 2019-01-01 DIAGNOSIS — N6311 Unspecified lump in the right breast, upper outer quadrant: Secondary | ICD-10-CM | POA: Diagnosis not present

## 2019-01-01 DIAGNOSIS — N6001 Solitary cyst of right breast: Secondary | ICD-10-CM | POA: Diagnosis not present

## 2019-01-21 DIAGNOSIS — E1121 Type 2 diabetes mellitus with diabetic nephropathy: Secondary | ICD-10-CM | POA: Diagnosis not present

## 2019-01-21 DIAGNOSIS — Z7984 Long term (current) use of oral hypoglycemic drugs: Secondary | ICD-10-CM | POA: Diagnosis not present

## 2019-01-27 ENCOUNTER — Other Ambulatory Visit: Payer: Self-pay | Admitting: Family Medicine

## 2019-02-11 ENCOUNTER — Ambulatory Visit: Payer: Self-pay

## 2019-02-19 ENCOUNTER — Other Ambulatory Visit: Payer: Self-pay

## 2019-02-19 NOTE — Patient Outreach (Signed)
[facility]) Care Management  [facility] Care Manager  02/19/2019   Erica Buckley [birth year] [id number]  Subjective: Telephone call to patient for quarterly check in. She reports she is doing good.  She continues to try to lower her A1c.  She states that her las A1c was almost 8.  Advised patient to continue to strive to control sugars. She verbalized understanding.  She states she has joined silver sneakers online 2-3 times a week.  She reports sugars in the range of 150-180.  Patient voices no concerns.    Objective:   Encounter Medications:  Outpatient Encounter Medications as of 02/19/2019  Medication Sig  . B Complex-C (B-COMPLEX WITH VITAMIN C) tablet Take 1 tablet by mouth every other day.   . diclofenac sodium (VOLTAREN) 1 % GEL Apply 2 g topically 2 (two) times daily.  . empagliflozin (JARDIANCE) 10 MG TABS tablet Take 10 mg by mouth daily.  . folic acid (FOLVITE) 400 MCG tablet Take 400 mcg by mouth every Monday, Wednesday, and Friday.  Marland Kitchen ibuprofen (ADVIL,MOTRIN) 800 MG tablet Take 800 mg by mouth every 8 (eight) hours as needed for moderate pain.  Marland Kitchen losartan-hydrochlorothiazide (HYZAAR) 50-12.5 MG per tablet Take 1 tablet by mouth daily.  . metoprolol succinate (TOPROL-XL) 25 MG 24 hr tablet Take 25 mg by mouth daily.  . ondansetron (ZOFRAN) 4 MG tablet Take 1 tablet (4 mg total) by mouth every 6 (six) hours as needed for nausea. (Patient not taking: Reported on 03/13/2018)  . traMADol (ULTRAM) 50 MG tablet Take 1 tablet (50 mg total) by mouth every 6 (six) hours as needed.   No facility-administered encounter medications on file as of 02/19/2019.    Functional Status:  In your present state of health, do you have any difficulty performing the following activities: 11/11/2018 02/28/2018  Hearing? N N  Vision? N N  Difficulty concentrating or making decisions? N N  Walking or climbing stairs? - N  Dressing or bathing? N N  Doing errands, shopping? N -   Preparing Food and eating ? N -  Using the Toilet? N -  In the past six months, have you accidently leaked urine? N -  Managing your Medications? N -  Managing your Finances? N -  Housekeeping or managing your Housekeeping? N -  Some recent data might be hidden    Fall/Depression Screening: Fall Risk  11/11/2018  Falls in the past year? 0  Number falls in past yr: 0   PHQ 2/9 Scores 11/11/2018 10/30/2018  PHQ - 2 Score 0 0    Assessment: Patient continues to work to lower her A1c.   Plan:  [facility] CM Care Plan Problem One     Most Recent Value  Care Plan Problem One  Diabetes Management as evidenced by A1c of 7.7  Role Documenting the Problem One  Care Management Telephonic Coordinator  Care Plan for Problem One  Active  THN Long Term Goal   Patient will decrease A1c less than 7.5 within the next 90 days.  THN Long Term Goal Start Date  02/19/19  Interventions for Problem One Long Term Goal  Discussed with patient diet and exercise being the key to controlling and lowering sugars.      RN CM will contact patient in the month of April and patient agreeable.  Bary Leriche, RN, MSN [facility] Care Management Care Management Coordinator Direct Line [phone] Cell [phone] Toll Free: [phone]  Fax: [phone]

## 2019-02-26 ENCOUNTER — Ambulatory Visit
Admission: RE | Admit: 2019-02-26 | Discharge: 2019-02-26 | Disposition: A | Discharge status: Home / Self Care | Payer: Medicare HMO | Source: Ambulatory Visit | Attending: Family Medicine | Admitting: Family Medicine

## 2019-02-26 ENCOUNTER — Other Ambulatory Visit: Payer: Self-pay

## 2019-02-26 DIAGNOSIS — H93A9 Pulsatile tinnitus, unspecified ear: Secondary | ICD-10-CM | POA: Diagnosis not present

## 2019-03-11 DIAGNOSIS — M65312 Trigger thumb, left thumb: Secondary | ICD-10-CM | POA: Diagnosis not present

## 2019-04-01 DIAGNOSIS — R1032 Left lower quadrant pain: Secondary | ICD-10-CM | POA: Diagnosis not present

## 2019-05-13 ENCOUNTER — Other Ambulatory Visit: Payer: Self-pay

## 2019-05-13 NOTE — Patient Outreach (Signed)
[facility]) Care Management  05/13/2019  Laurieann Friddle Ketchem 1940-06-01 [id number]   Telephone call to patient for quarterly diabetes check in.  No answer.  HIPAA compliant voice message left.   Plan: RN CM will will attempt again the the month of July and send letter.   Bary Leriche, RN, MSN [facility] Care Management Care Management Coordinator Direct Line [phone] Cell [phone] Toll Free: [phone]  Fax: [phone]

## 2019-05-14 ENCOUNTER — Ambulatory Visit: Payer: Self-pay

## 2019-05-28 ENCOUNTER — Other Ambulatory Visit: Payer: Self-pay | Admitting: *Deleted

## 2019-05-28 DIAGNOSIS — L97919 Non-pressure chronic ulcer of unspecified part of right lower leg with unspecified severity: Secondary | ICD-10-CM

## 2019-05-28 DIAGNOSIS — I83219 Varicose veins of right lower extremity with both ulcer of unspecified site and inflammation: Secondary | ICD-10-CM

## 2019-05-29 ENCOUNTER — Telehealth (HOSPITAL_COMMUNITY): Payer: Self-pay

## 2019-05-29 NOTE — Telephone Encounter (Signed)

## 2019-05-30 ENCOUNTER — Ambulatory Visit (INDEPENDENT_AMBULATORY_CARE_PROVIDER_SITE_OTHER): Payer: Medicare HMO | Admitting: Vascular Surgery

## 2019-05-30 ENCOUNTER — Encounter: Payer: Self-pay | Admitting: Vascular Surgery

## 2019-05-30 ENCOUNTER — Ambulatory Visit (HOSPITAL_COMMUNITY)
Admission: RE | Admit: 2019-05-30 | Discharge: 2019-05-30 | Disposition: A | Discharge status: Home / Self Care | Payer: Medicare HMO | Source: Ambulatory Visit | Attending: Vascular Surgery | Admitting: Vascular Surgery

## 2019-05-30 ENCOUNTER — Other Ambulatory Visit: Payer: Self-pay

## 2019-05-30 VITALS — BP 119/67 | HR 60 | Temp 97.6°F | Resp 16 | Ht 66.0 in | Wt 223.0 lb

## 2019-05-30 DIAGNOSIS — I83219 Varicose veins of right lower extremity with both ulcer of unspecified site and inflammation: Secondary | ICD-10-CM | POA: Insufficient documentation

## 2019-05-30 DIAGNOSIS — I872 Venous insufficiency (chronic) (peripheral): Secondary | ICD-10-CM

## 2019-05-30 DIAGNOSIS — I83229 Varicose veins of left lower extremity with both ulcer of unspecified site and inflammation: Secondary | ICD-10-CM

## 2019-05-30 DIAGNOSIS — L97929 Non-pressure chronic ulcer of unspecified part of left lower leg with unspecified severity: Secondary | ICD-10-CM | POA: Diagnosis not present

## 2019-05-30 DIAGNOSIS — L97919 Non-pressure chronic ulcer of unspecified part of right lower leg with unspecified severity: Secondary | ICD-10-CM

## 2019-05-30 NOTE — Progress Notes (Signed)
Patient ID: Erica Buckley, female   DOB: 12/02/1940, 79 y.o.   MRN: [id number]  Reason for Consult: New Patient (Initial Visit) (bilateral varocose veins right leg worse)   Referred by Harlan Stains, MD  Subjective:     HPI:  Erica Buckley is a 79 y.o. female with history of right lower extremity vein stripping.  She now has recurrent varicose veins and swelling with pain in the right lower extremity.  Denies any history of DVT in the right lower extremity does not have any bleeding issues.  Left side does have minimal swelling but no notable varicosities and no pain.  Past Medical History:  Diagnosis Date  . Anemia yrs ago  . Bartholin cyst   . Diabetes mellitus without complication (HCC)    fasting 110s type 2  . Hypertension   . PONV (postoperative nausea and vomiting)    nausea only  . Ulcer   . Varicose veins    both legs   Family History  Problem Relation Age of Onset  . Cancer Father   . Diabetes Father   . Pancreatic cancer Father   . Colon cancer Sister   . Diabetes Maternal Grandfather   . Heart disease Maternal Grandfather    Past Surgical History:  Procedure Laterality Date  . ABDOMINAL HYSTERECTOMY    . BARTHOLIN CYST MARSUPIALIZATION Right 02/28/2018   Procedure: BARTHOLIN CYST MARSUPIALIZATION WITH POSSIBLE BIOPSY;  Surgeon: Isabel Caprice, MD;  Location: [facility];  Service: Gynecology;  Laterality: Right;  . CHOLECYSTECTOMY     About 10 years ago  . ENDOVENOUS ABLATION SAPHENOUS VEIN W/ LASER  02-29-2012   right small saphenous vein  by Curt Jews MD  . ENDOVENOUS ABLATION SAPHENOUS VEIN W/ LASER Left 04-11-2012   left greater saphenous by Curt Jews MD  . KNEE ARTHROSCOPY Left   . TOTAL KNEE ARTHROPLASTY Left 08/25/2013   Procedure: LEFT TOTAL KNEE ARTHROPLASTY;  Surgeon: Alta Corning, MD;  Location: [place];  Service: Orthopedics;  Laterality: Left;  Marland Kitchen VEIN LIGATION AND STRIPPING  1978    Short Social  History:  Social History   Tobacco Use  . Smoking status: Never Smoker  . Smokeless tobacco: Never Used  Substance Use Topics  . Alcohol use: Yes    Alcohol/week: 1.0 standard drinks    Types: 1 Glasses of wine per week    Comment: occassionally    Allergies  Allergen Reactions  . Codeine Shortness Of Breath  . Other     No blood products, jehovah witness    Current Outpatient Medications  Medication Sig Dispense Refill  . B Complex-C (B-COMPLEX WITH VITAMIN C) tablet Take 1 tablet by mouth every other day.     . diclofenac sodium (VOLTAREN) 1 % GEL Apply 2 g topically 2 (two) times daily.    . empagliflozin (JARDIANCE) 10 MG TABS tablet Take 10 mg by mouth daily.    Marland Kitchen ibuprofen (ADVIL,MOTRIN) 800 MG tablet Take 800 mg by mouth every 8 (eight) hours as needed for moderate pain.    Marland Kitchen losartan-hydrochlorothiazide (HYZAAR) 50-12.5 MG per tablet Take 1 tablet by mouth daily.    . metoprolol succinate (TOPROL-XL) 25 MG 24 hr tablet Take 25 mg by mouth daily.    . folic acid (FOLVITE) 409 MCG tablet Take 400 mcg by mouth every Monday, Wednesday, and Friday.    . ondansetron (ZOFRAN) 4 MG tablet Take 1 tablet (4 mg total) by mouth every 6 (six)  hours as needed for nausea. (Patient not taking: Reported on 05/30/2019) 12 tablet 0  . traMADol (ULTRAM) 50 MG tablet Take 1 tablet (50 mg total) by mouth every 6 (six) hours as needed. (Patient not taking: Reported on 05/30/2019) 10 tablet 0   No current facility-administered medications for this visit.    Review of Systems  Constitutional:  Constitutional negative. HENT: HENT negative.  Eyes: Eyes negative.  Cardiovascular: Positive for leg swelling.  GI: Gastrointestinal negative.  Musculoskeletal: Positive for leg pain.  Skin: Skin negative.  Neurological: Neurological negative. Hematologic: Hematologic/lymphatic negative.  Psychiatric: Psychiatric negative.        Objective:  Objective   Vitals:   05/30/19 1352  BP: 119/67   Pulse: 60  Resp: 16  Temp: 97.6 F (36.4 C)  TempSrc: Temporal  SpO2: 97%  Weight: 223 lb (101.2 kg)  Height: 5\' 6"  (1.676 m)   Body mass index is 35.99 kg/m.  Physical Exam HENT:     Head: Normocephalic.     Nose:     Comments: Mask in place Eyes:     Pupils: Pupils are equal, round, and reactive to light.  Cardiovascular:     Pulses: Normal pulses.  Abdominal:     Palpations: Abdomen is soft.  Musculoskeletal:     Right lower leg: Edema present.     Left lower leg: Edema present.     Comments: Significant varicosities right posterior leg  Skin:    General: Skin is warm.     Capillary Refill: Capillary refill takes less than 2 seconds.  Neurological:     General: No focal deficit present.     Mental Status: She is alert.  Psychiatric:        Mood and Affect: Mood normal.        Behavior: Behavior normal.        Thought Content: Thought content normal.        Judgment: Judgment normal.     Data: I have independently interpreted her right lower extremity venous reflux study which demonstrates venous reflux throughout her small saphenous vein measuring up to 0.74 cm at the popliteal fossa and 0.72 cm in the proximal calf.  Her greater saphenous vein measured 0.62 cm in the proximal thigh but there is no reflux     Assessment/Plan:    79 year old female with right lower extremity venous reflux associated with varicosities and edema consistent with C3 venous disease.  We have discussed thigh-high compression stockings for 3 months and serial follow-up for consideration of small saphenous vein ablation and stab phlebectomy.     70 MD [facility]

## 2019-06-11 DIAGNOSIS — N182 Chronic kidney disease, stage 2 (mild): Secondary | ICD-10-CM | POA: Diagnosis not present

## 2019-06-11 DIAGNOSIS — I129 Hypertensive chronic kidney disease with stage 1 through stage 4 chronic kidney disease, or unspecified chronic kidney disease: Secondary | ICD-10-CM | POA: Diagnosis not present

## 2019-06-11 DIAGNOSIS — E1121 Type 2 diabetes mellitus with diabetic nephropathy: Secondary | ICD-10-CM | POA: Diagnosis not present

## 2019-06-26 DIAGNOSIS — I129 Hypertensive chronic kidney disease with stage 1 through stage 4 chronic kidney disease, or unspecified chronic kidney disease: Secondary | ICD-10-CM | POA: Diagnosis not present

## 2019-06-26 DIAGNOSIS — N182 Chronic kidney disease, stage 2 (mild): Secondary | ICD-10-CM | POA: Diagnosis not present

## 2019-06-26 DIAGNOSIS — M1712 Unilateral primary osteoarthritis, left knee: Secondary | ICD-10-CM | POA: Diagnosis not present

## 2019-06-26 DIAGNOSIS — E1121 Type 2 diabetes mellitus with diabetic nephropathy: Secondary | ICD-10-CM | POA: Diagnosis not present

## 2019-07-09 DIAGNOSIS — N950 Postmenopausal bleeding: Secondary | ICD-10-CM | POA: Diagnosis not present

## 2019-07-22 DIAGNOSIS — E1121 Type 2 diabetes mellitus with diabetic nephropathy: Secondary | ICD-10-CM | POA: Diagnosis not present

## 2019-07-28 DIAGNOSIS — M1712 Unilateral primary osteoarthritis, left knee: Secondary | ICD-10-CM | POA: Diagnosis not present

## 2019-07-28 DIAGNOSIS — I129 Hypertensive chronic kidney disease with stage 1 through stage 4 chronic kidney disease, or unspecified chronic kidney disease: Secondary | ICD-10-CM | POA: Diagnosis not present

## 2019-07-28 DIAGNOSIS — N182 Chronic kidney disease, stage 2 (mild): Secondary | ICD-10-CM | POA: Diagnosis not present

## 2019-07-28 DIAGNOSIS — E1121 Type 2 diabetes mellitus with diabetic nephropathy: Secondary | ICD-10-CM | POA: Diagnosis not present

## 2019-08-05 ENCOUNTER — Other Ambulatory Visit: Payer: Self-pay

## 2019-08-05 NOTE — Patient Outreach (Signed)
[facility]) Care Management  08/05/2019  Erica Buckley July 14, 1940 [id number]   Telephone call to patient for disease management follow up. No answer.  HIPAA compliant voice message left.   Plan: RN CM will attempt again in the month of October.    Bary Leriche, RN, MSN [facility] Care Management Care Management Coordinator Direct Line [phone] Cell [phone] Toll Free: [phone]  Fax: [phone]

## 2019-08-06 ENCOUNTER — Ambulatory Visit: Payer: Self-pay

## 2019-09-02 ENCOUNTER — Encounter: Payer: Self-pay | Admitting: *Deleted

## 2019-09-04 ENCOUNTER — Ambulatory Visit: Payer: Medicare HMO | Admitting: Vascular Surgery

## 2019-09-24 DIAGNOSIS — Z20822 Contact with and (suspected) exposure to covid-19: Secondary | ICD-10-CM | POA: Diagnosis not present

## 2019-09-24 DIAGNOSIS — Z03818 Encounter for observation for suspected exposure to other biological agents ruled out: Secondary | ICD-10-CM | POA: Diagnosis not present

## 2019-10-08 DIAGNOSIS — H524 Presbyopia: Secondary | ICD-10-CM | POA: Diagnosis not present

## 2019-10-08 DIAGNOSIS — E119 Type 2 diabetes mellitus without complications: Secondary | ICD-10-CM | POA: Diagnosis not present

## 2019-10-13 DIAGNOSIS — Z0100 Encounter for examination of eyes and vision without abnormal findings: Secondary | ICD-10-CM | POA: Diagnosis not present

## 2019-10-14 DIAGNOSIS — I129 Hypertensive chronic kidney disease with stage 1 through stage 4 chronic kidney disease, or unspecified chronic kidney disease: Secondary | ICD-10-CM | POA: Diagnosis not present

## 2019-10-14 DIAGNOSIS — N182 Chronic kidney disease, stage 2 (mild): Secondary | ICD-10-CM | POA: Diagnosis not present

## 2019-10-14 DIAGNOSIS — K625 Hemorrhage of anus and rectum: Secondary | ICD-10-CM | POA: Diagnosis not present

## 2019-10-14 DIAGNOSIS — E1121 Type 2 diabetes mellitus with diabetic nephropathy: Secondary | ICD-10-CM | POA: Diagnosis not present

## 2019-10-14 DIAGNOSIS — N950 Postmenopausal bleeding: Secondary | ICD-10-CM | POA: Diagnosis not present

## 2019-10-22 DIAGNOSIS — M65312 Trigger thumb, left thumb: Secondary | ICD-10-CM | POA: Diagnosis not present

## 2019-10-29 DIAGNOSIS — N952 Postmenopausal atrophic vaginitis: Secondary | ICD-10-CM | POA: Diagnosis not present

## 2019-11-04 ENCOUNTER — Other Ambulatory Visit: Payer: Self-pay

## 2019-11-04 NOTE — Patient Outreach (Signed)
[facility]) Care Management  [facility] Care Manager  11/04/2019   Pacey Willadsen Morell 14-Nov-1940 [id number]  Subjective: Return call from patient, she reports doing good.  She reports a bout with vertigo but has medication for that.  She reports A1c 7.9.  MD goal of 7.5.  Discussed diabetes management with patient.  She reports she is going to start water aerobics soon.  Encouraged patient to do water aerobics as it is a great source of exercise and it is good for knees and joints as well. She verbalized understanding and voices no concerns.   Objective:   Encounter Medications:  Outpatient Encounter Medications as of 11/04/2019  Medication Sig  . B Complex-C (B-COMPLEX WITH VITAMIN C) tablet Take 1 tablet by mouth every other day.   . diclofenac sodium (VOLTAREN) 1 % GEL Apply 2 g topically 2 (two) times daily.  . empagliflozin (JARDIANCE) 10 MG TABS tablet Take 10 mg by mouth daily.  . folic acid (FOLVITE) 400 MCG tablet Take 400 mcg by mouth every Monday, Wednesday, and Friday.  Marland Kitchen ibuprofen (ADVIL,MOTRIN) 800 MG tablet Take 800 mg by mouth every 8 (eight) hours as needed for moderate pain.  Marland Kitchen losartan-hydrochlorothiazide (HYZAAR) 50-12.5 MG per tablet Take 1 tablet by mouth daily.  . metoprolol succinate (TOPROL-XL) 25 MG 24 hr tablet Take 25 mg by mouth daily.  . ondansetron (ZOFRAN) 4 MG tablet Take 1 tablet (4 mg total) by mouth every 6 (six) hours as needed for nausea. (Patient not taking: Reported on 05/30/2019)  . traMADol (ULTRAM) 50 MG tablet Take 1 tablet (50 mg total) by mouth every 6 (six) hours as needed. (Patient not taking: Reported on 05/30/2019)   No facility-administered encounter medications on file as of 11/04/2019.    Functional Status:  In your present state of health, do you have any difficulty performing the following activities: 11/04/2019 11/11/2018  Hearing? N N  Vision? N N  Difficulty concentrating or making decisions? N N  Walking or  climbing stairs? N -  Dressing or bathing? N N  Doing errands, shopping? N N  Preparing Food and eating ? N N  Using the Toilet? N N  In the past six months, have you accidently leaked urine? N N  Do you have problems with loss of bowel control? N -  Managing your Medications? N N  Managing your Finances? N N  Housekeeping or managing your Housekeeping? N N  Some recent data might be hidden    Fall/Depression Screening: Fall Risk  11/04/2019 02/19/2019 11/11/2018  Falls in the past year? 0 0 0  Number falls in past yr: - - 0   PHQ 2/9 Scores 11/04/2019 11/11/2018 10/30/2018  PHQ - 2 Score 0 0 0    Assessment: Patient continues to manage chronic conditions and and agrees to ongoing disease management support.   Goals Addressed            This Visit's Progress   . Monitor and Manage My Blood Sugar       Follow Up Date  02/29/20   - check blood sugar at prescribed times - enter blood sugar readings and medication or insulin into daily log - take the blood sugar log to all doctor visits    Why is this important?   Checking your blood sugar at home helps to keep it from getting very high or very low.  Writing the results in a diary or log helps the doctor know how to care  for you.  Your blood sugar log should have the time, date and the results.  Also, write down the amount of insulin or other medicine that you take.  Other information, like what you ate, exercise done and how you were feeling, will also be helpful.     Notes: Take medications as prescribed and continue to monitor sugars.      . Set My Target A1C       Follow Up Date 02/29/20   - set target A1C    Why is this important?   Your target A1C is decided together by you and your doctor.  It is based on several things like your age and other health issues.    Notes: Target A1c 7.5.  Most recent A1c Sept. 21, 2021= 7.9.  Work on increasing activity and exercise.         Plan:  RN CM will contact in January  and patient agreeable.    Bary Leriche, RN, MSN [facility] Care Management Care Management Coordinator Direct Line [phone] Cell [phone] Toll Free: [phone]  Fax: [phone]

## 2019-11-04 NOTE — Patient Outreach (Signed)
[facility]) Care Management  11/04/2019  Erica Buckley 10/03/1940 [id number]   Telephone call to patient for disease management follow up.   No answer.  HIPAA compliant voice message left.    Plan: If no return call, RN CM will attempt patient again in the month of January.    Bary Leriche, RN, MSN [facility] Care Management Care Management Coordinator Direct Line [phone] Cell [phone] Toll Free: [phone]  Fax: [phone]

## 2019-11-26 DIAGNOSIS — E1121 Type 2 diabetes mellitus with diabetic nephropathy: Secondary | ICD-10-CM | POA: Diagnosis not present

## 2019-11-27 DIAGNOSIS — I129 Hypertensive chronic kidney disease with stage 1 through stage 4 chronic kidney disease, or unspecified chronic kidney disease: Secondary | ICD-10-CM | POA: Diagnosis not present

## 2019-11-27 DIAGNOSIS — E1121 Type 2 diabetes mellitus with diabetic nephropathy: Secondary | ICD-10-CM | POA: Diagnosis not present

## 2019-11-27 DIAGNOSIS — M1712 Unilateral primary osteoarthritis, left knee: Secondary | ICD-10-CM | POA: Diagnosis not present

## 2019-11-27 DIAGNOSIS — N182 Chronic kidney disease, stage 2 (mild): Secondary | ICD-10-CM | POA: Diagnosis not present

## 2019-12-02 ENCOUNTER — Ambulatory Visit: Payer: Medicare HMO

## 2019-12-09 ENCOUNTER — Ambulatory Visit: Payer: Medicare HMO

## 2019-12-24 DIAGNOSIS — I129 Hypertensive chronic kidney disease with stage 1 through stage 4 chronic kidney disease, or unspecified chronic kidney disease: Secondary | ICD-10-CM | POA: Diagnosis not present

## 2019-12-24 DIAGNOSIS — M1712 Unilateral primary osteoarthritis, left knee: Secondary | ICD-10-CM | POA: Diagnosis not present

## 2019-12-24 DIAGNOSIS — E1121 Type 2 diabetes mellitus with diabetic nephropathy: Secondary | ICD-10-CM | POA: Diagnosis not present

## 2019-12-24 DIAGNOSIS — N182 Chronic kidney disease, stage 2 (mild): Secondary | ICD-10-CM | POA: Diagnosis not present

## 2020-01-01 ENCOUNTER — Encounter: Payer: Self-pay | Admitting: Vascular Surgery

## 2020-01-01 ENCOUNTER — Other Ambulatory Visit: Payer: Self-pay

## 2020-01-01 ENCOUNTER — Ambulatory Visit: Payer: Medicare HMO | Admitting: Vascular Surgery

## 2020-01-01 VITALS — BP 133/76 | HR 61 | Temp 97.0°F | Resp 16 | Ht 66.0 in | Wt 220.0 lb

## 2020-01-01 DIAGNOSIS — I83811 Varicose veins of right lower extremities with pain: Secondary | ICD-10-CM | POA: Diagnosis not present

## 2020-01-01 DIAGNOSIS — I872 Venous insufficiency (chronic) (peripheral): Secondary | ICD-10-CM

## 2020-01-01 NOTE — Progress Notes (Signed)
REASON FOR VISIT:   Follow-up of chronic venous disease.  MEDICAL ISSUES:   PAINFUL VARICOSE VEINS RIGHT LOWER EXTREMITY/CHRONIC VENOUS DISEASE: This patient has painful varicose veins of the right lower extremity.  She has CEAP C4a venous disease. She has failed conservative treatment.  I think she would be a good candidate for laser ablation of the right small saphenous vein with 10-20 stabs.  I would cannulate the vein in the proximal calf.  Of note, from a technical standpoint the vein is very tortuous and I explained that we may have difficulty getting through the vein because of the tortuosity.  I have discussed the indications for endovenous laser ablation of the right small saphenous vein, that is to lower the pressure in the veins and potentially help relieve the symptoms from venous hypertension. I have also discussed alternative options including conservative treatment with leg elevation, compression therapy, exercise, avoiding prolonged sitting and standing, and weight management. I have discussed the potential complications of the procedure, including, but not limited to: bleeding, bruising, leg swelling, nerve injury, skin burns, significant pain from phlebitis, deep venous thrombosis, or failure of the vein to close.  I have also explained that venous insufficiency is a chronic disease, and that the patient is at risk for recurrent varicose veins in the future.  All of the patient's questions were encouraged and answered. They are agreeable to proceed.   I have discussed with the patient the indications for stab phlebectomy.  I have explained to the patient that that will have small scars from the stab incisions.  I explained that the other risks include leg swelling, bruising, bleeding, and phlebitis.  All the patient's questions were encouraged and answered and they are agreeable to proceed.  I have again reinforced conservative measures including intermittent leg elevation and the  proper positioning for this.  We have also discussed importance of exercise specifically walking and water aerobics.  She does do water aerobics which she does say helps.  I encouraged her to avoid prolonged sitting and standing.  She will continue to wear her compression stockings.  We also discussed the importance of maintaining a healthy weight as central obesity especially increases lower extremity venous pressure.   HPI:   Erica Buckley is a pleasant 79 y.o. female who was seen by Dr. Randie Heinz on 05/30/2019 with venous disease.  The patient is previously had stripping of varicose veins in the right lower extremity.  She has developed recurrent varicose veins and swelling and pain in the right lower extremity.  She is unaware of any previous history of DVT.  She had minimal swelling on the left side.  She was instructed to elevate her legs and was prescribed thigh-high compression stockings with a gradient of 20 to 30 mmHg.  If her symptoms persisted was felt that she might be a candidate for laser ablation of the small saphenous vein on the right.  Past Medical History:  Diagnosis Date  . Anemia yrs ago  . Bartholin cyst   . Diabetes mellitus without complication (HCC)    fasting 110s type 2  . Hypertension   . PONV (postoperative nausea and vomiting)    nausea only  . Ulcer   . Varicose veins    both legs    Family History  Problem Relation Age of Onset  . Cancer Father   . Diabetes Father   . Pancreatic cancer Father   . Colon cancer Sister   . Diabetes Maternal Grandfather   .  Heart disease Maternal Grandfather     SOCIAL HISTORY: Social History   Tobacco Use  . Smoking status: Never Smoker  . Smokeless tobacco: Never Used  Substance Use Topics  . Alcohol use: Yes    Alcohol/week: 1.0 standard drink    Types: 1 Glasses of wine per week    Comment: occassionally    Allergies  Allergen Reactions  . Codeine Shortness Of Breath  . Other     No blood products,  jehovah witness    Current Outpatient Medications  Medication Sig Dispense Refill  . B Complex-C (B-COMPLEX WITH VITAMIN C) tablet Take 1 tablet by mouth every other day.     . diclofenac sodium (VOLTAREN) 1 % GEL Apply 2 g topically 2 (two) times daily.    . empagliflozin (JARDIANCE) 10 MG TABS tablet Take 10 mg by mouth daily.    . folic acid (FOLVITE) 400 MCG tablet Take 400 mcg by mouth every Monday, Wednesday, and Friday.    Marland Kitchen ibuprofen (ADVIL,MOTRIN) 800 MG tablet Take 800 mg by mouth every 8 (eight) hours as needed for moderate pain.    Marland Kitchen losartan-hydrochlorothiazide (HYZAAR) 50-12.5 MG per tablet Take 1 tablet by mouth daily.    . metoprolol succinate (TOPROL-XL) 25 MG 24 hr tablet Take 25 mg by mouth daily.    . ondansetron (ZOFRAN) 4 MG tablet Take 1 tablet (4 mg total) by mouth every 6 (six) hours as needed for nausea. (Patient not taking: Reported on 05/30/2019) 12 tablet 0  . traMADol (ULTRAM) 50 MG tablet Take 1 tablet (50 mg total) by mouth every 6 (six) hours as needed. (Patient not taking: Reported on 05/30/2019) 10 tablet 0   No current facility-administered medications for this visit.    REVIEW OF SYSTEMS:  [X]  denotes positive finding, [ ]  denotes negative finding Cardiac  Comments:  Chest pain or chest pressure:    Shortness of breath upon exertion:    Short of breath when lying flat:    Irregular heart rhythm:        Vascular    Pain in calf, thigh, or hip brought on by ambulation:    Pain in feet at night that wakes you up from your sleep:     Blood clot in your veins:    Leg swelling:  x       Pulmonary    Oxygen at home:    Productive cough:     Wheezing:         Neurologic    Sudden weakness in arms or legs:     Sudden numbness in arms or legs:     Sudden onset of difficulty speaking or slurred speech:    Temporary loss of vision in one eye:     Problems with dizziness:         Gastrointestinal    Blood in stool:     Vomited blood:          Genitourinary    Burning when urinating:     Blood in urine:        Psychiatric    Major depression:         Hematologic    Bleeding problems:    Problems with blood clotting too easily:        Skin    Rashes or ulcers:        Constitutional    Fever or chills:     PHYSICAL EXAM:   Vitals:   01/01/20 1347  BP: 133/76  Pulse: 61  Resp: 16  Temp: (!) 97 F (36.1 C)  TempSrc: Temporal  SpO2: 97%  Weight: 220 lb (99.8 kg)  Height: 5\' 6"  (1.676 m)    GENERAL: The patient is a well-nourished female, in no acute distress. The vital signs are documented above. CARDIAC: There is a regular rate and rhythm.  VASCULAR: I do not detect carotid bruits. She has palpable pedal pulses bilaterally. She has hyperpigmentation bilaterally consistent with chronic venous insufficiency.      She has dilated varicose veins in her posterior calf on the right. I did look at her small saphenous vein myself with the SonoSite and it looks like I could cannulate this in the proximal calf.  Above that the vein becomes tortuous which may present a technical challenge.  However I can clearly see the saphenous popliteal junction. PULMONARY: There is good air exchange bilaterally without wheezing or rales. ABDOMEN: Soft and non-tender with normal pitched bowel sounds.  MUSCULOSKELETAL: There are no major deformities or cyanosis. NEUROLOGIC: No focal weakness or paresthesias are detected. SKIN: There are no ulcers or rashes noted. PSYCHIATRIC: The patient has a normal affect.  DATA:    VENOUS DUPLEX: I have reviewed her venous duplex scan from earlier this year.  This was of the right lower extremity only.  She has no evidence of DVT.  She has deep venous reflux involving the femoral vein and popliteal vein  She does not have any significant reflux in the right great saphenous vein.  However she has significant reflux in the right small saphenous vein in the proximal calf.  Diameters range  from 0.46-0.72 cm.  03-20-1969 [facility] [phone]

## 2020-01-16 ENCOUNTER — Other Ambulatory Visit: Payer: Self-pay | Admitting: *Deleted

## 2020-01-16 DIAGNOSIS — I83811 Varicose veins of right lower extremities with pain: Secondary | ICD-10-CM

## 2020-01-20 DIAGNOSIS — Z23 Encounter for immunization: Secondary | ICD-10-CM | POA: Diagnosis not present

## 2020-01-20 DIAGNOSIS — N182 Chronic kidney disease, stage 2 (mild): Secondary | ICD-10-CM | POA: Diagnosis not present

## 2020-01-20 DIAGNOSIS — E1121 Type 2 diabetes mellitus with diabetic nephropathy: Secondary | ICD-10-CM | POA: Diagnosis not present

## 2020-01-20 DIAGNOSIS — I129 Hypertensive chronic kidney disease with stage 1 through stage 4 chronic kidney disease, or unspecified chronic kidney disease: Secondary | ICD-10-CM | POA: Diagnosis not present

## 2020-01-31 ENCOUNTER — Encounter: Payer: Self-pay | Admitting: Vascular Surgery

## 2020-02-03 ENCOUNTER — Other Ambulatory Visit: Payer: Self-pay

## 2020-02-03 ENCOUNTER — Other Ambulatory Visit: Payer: Self-pay | Admitting: *Deleted

## 2020-02-03 MED ORDER — LORAZEPAM 1 MG PO TABS: ORAL_TABLET | ORAL | 0 refills | Status: DC

## 2020-02-03 NOTE — Patient Outreach (Signed)
[facility]) Care Management  [facility] Care Manager  02/03/2020   Faryn Sieg [place] 09-08-1940 [id number]  Subjective: Telephone call to patient for disease management follow up. Patient doing good.  She reports blood sugar ranging about 134.  A1c noted to be 7.8.  Discussed diabetes management. She verbalized understanding and voices no concerns.  Objective:   Encounter Medications:  Outpatient Encounter Medications as of 02/03/2020  Medication Sig Note  . B Complex-C (B-COMPLEX WITH VITAMIN C) tablet Take 1 tablet by mouth every other day.    . diclofenac sodium (VOLTAREN) 1 % GEL Apply 2 g topically 2 (two) times daily.   . empagliflozin (JARDIANCE) 10 MG TABS tablet Take 10 mg by mouth daily.   . folic acid (FOLVITE) 400 MCG tablet Take 400 mcg by mouth every Monday, Wednesday, and Friday.   Marland Kitchen ibuprofen (ADVIL,MOTRIN) 800 MG tablet Take 800 mg by mouth every 8 (eight) hours as needed for moderate pain.   Marland Kitchen losartan-hydrochlorothiazide (HYZAAR) 50-12.5 MG per tablet Take 1 tablet by mouth daily.   . metoprolol succinate (TOPROL-XL) 25 MG 24 hr tablet Take 25 mg by mouth daily.   . ondansetron (ZOFRAN) 4 MG tablet Take 1 tablet (4 mg total) by mouth every 6 (six) hours as needed for nausea. (Patient not taking: No sig reported)   . TRADJENTA 5 MG TABS tablet  02/03/2020: One tablet per day  . traMADol (ULTRAM) 50 MG tablet Take 1 tablet (50 mg total) by mouth every 6 (six) hours as needed. (Patient not taking: No sig reported)    No facility-administered encounter medications on file as of 02/03/2020.    Functional Status:  In your present state of health, do you have any difficulty performing the following activities: 11/04/2019  Hearing? N  Vision? N  Difficulty concentrating or making decisions? N  Walking or climbing stairs? N  Dressing or bathing? N  Doing errands, shopping? N  Preparing Food and eating ? N  Using the Toilet? N  In the past six months, have  you accidently leaked urine? N  Do you have problems with loss of bowel control? N  Managing your Medications? N  Managing your Finances? N  Housekeeping or managing your Housekeeping? N  Some recent data might be hidden    Fall/Depression Screening: Fall Risk  02/03/2020 11/04/2019 02/19/2019  Falls in the past year? 0 0 0  Number falls in past yr: 0 - -  Injury with Fall? 0 - -   PHQ 2/9 Scores 02/03/2020 11/04/2019 11/11/2018 10/30/2018  PHQ - 2 Score 0 0 0 0    Assessment: Patient continues to manage chronic conditions. Goals Addressed            This Visit's Progress   . Monitor and Manage My Blood Sugar       Timeframe:  Long-Range Goal Priority:  High Start Date:  02/03/20                           Expected End Date:     05/29/20                 Follow Up Date  05/29/20   - check blood sugar at prescribed times - enter blood sugar readings and medication or insulin into daily log - take the blood sugar log to all doctor visits    Why is this important?   Checking your blood sugar at home helps  to keep it from getting very high or very low.  Writing the results in a diary or log helps the doctor know how to care for you.  Your blood sugar log should have the time, date and the results.  Also, write down the amount of insulin or other medicine that you take.  Other information, like what you ate, exercise done and how you were feeling, will also be helpful.     Notes: Take medications as prescribed and continue to monitor sugars.      . COMPLETED: Set My Target A1C       Follow Up Date 02/29/20   - set target A1C    Why is this important?   Your target A1C is decided together by you and your doctor.  It is based on several things like your age and other health issues.    Notes: Target A1c 7.5.  Most recent A1c Sept. 21, 2021= 7.9.  Work on increasing activity and exercise.         Plan: RN CM will follow up in April.  Follow-up:  Patient agrees to Care Plan and  Follow-up.   Bary Leriche, RN, MSN [facility] Care Management Care Management Coordinator Direct Line [phone] Cell [phone] Toll Free: [phone]  Fax: [phone]

## 2020-02-03 NOTE — Patient Instructions (Signed)
Goals Addressed            This Visit's Progress   . Monitor and Manage My Blood Sugar       Timeframe:  Long-Range Goal Priority:  High Start Date:  02/03/20                           Expected End Date:     05/29/20                 Follow Up Date  05/29/20   - check blood sugar at prescribed times - enter blood sugar readings and medication or insulin into daily log - take the blood sugar log to all doctor visits    Why is this important?   Checking your blood sugar at home helps to keep it from getting very high or very low.  Writing the results in a diary or log helps the doctor know how to care for you.  Your blood sugar log should have the time, date and the results.  Also, write down the amount of insulin or other medicine that you take.  Other information, like what you ate, exercise done and how you were feeling, will also be helpful.     Notes: Take medications as prescribed and continue to monitor sugars.      . COMPLETED: Set My Target A1C       Follow Up Date 02/29/20   - set target A1C    Why is this important?   Your target A1C is decided together by you and your doctor.  It is based on several things like your age and other health issues.    Notes: Target A1c 7.5.  Most recent A1c Sept. 21, 2021= 7.9.  Work on increasing activity and exercise.

## 2020-02-05 ENCOUNTER — Encounter: Payer: Self-pay | Admitting: Vascular Surgery

## 2020-02-05 ENCOUNTER — Other Ambulatory Visit: Payer: Self-pay

## 2020-02-05 ENCOUNTER — Ambulatory Visit (INDEPENDENT_AMBULATORY_CARE_PROVIDER_SITE_OTHER): Payer: Medicare HMO | Admitting: Vascular Surgery

## 2020-02-05 VITALS — BP 117/74 | HR 74 | Temp 97.9°F | Resp 16 | Ht 66.0 in | Wt 219.0 lb

## 2020-02-05 DIAGNOSIS — I83811 Varicose veins of right lower extremities with pain: Secondary | ICD-10-CM

## 2020-02-05 HISTORY — PX: OTHER SURGICAL HISTORY: SHX169

## 2020-02-05 IMAGING — US US BREAST*R* LIMITED INC AXILLA
1 series · 5 of 5 positions shown · non-contrast
Comparison: Previous exam(s).

CLINICAL DATA: Screening recall for right breast mass.

EXAM:
DIGITAL DIAGNOSTIC UNILATERAL RIGHT MAMMOGRAM WITH CAD AND TOMO
RIGHT BREAST ULTRASOUND

[Series 1: us breast*right* limited inc axilla · 0.06mm/px · 5 of 5 slices shown]
[im 1/5]
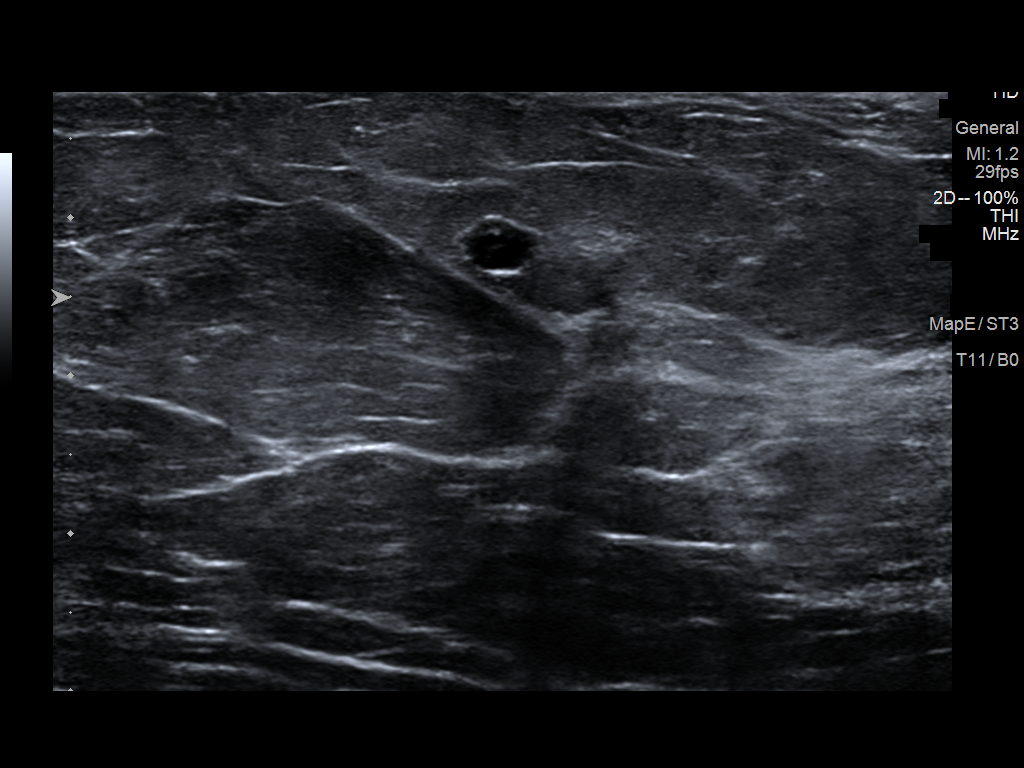
[im 2/5]
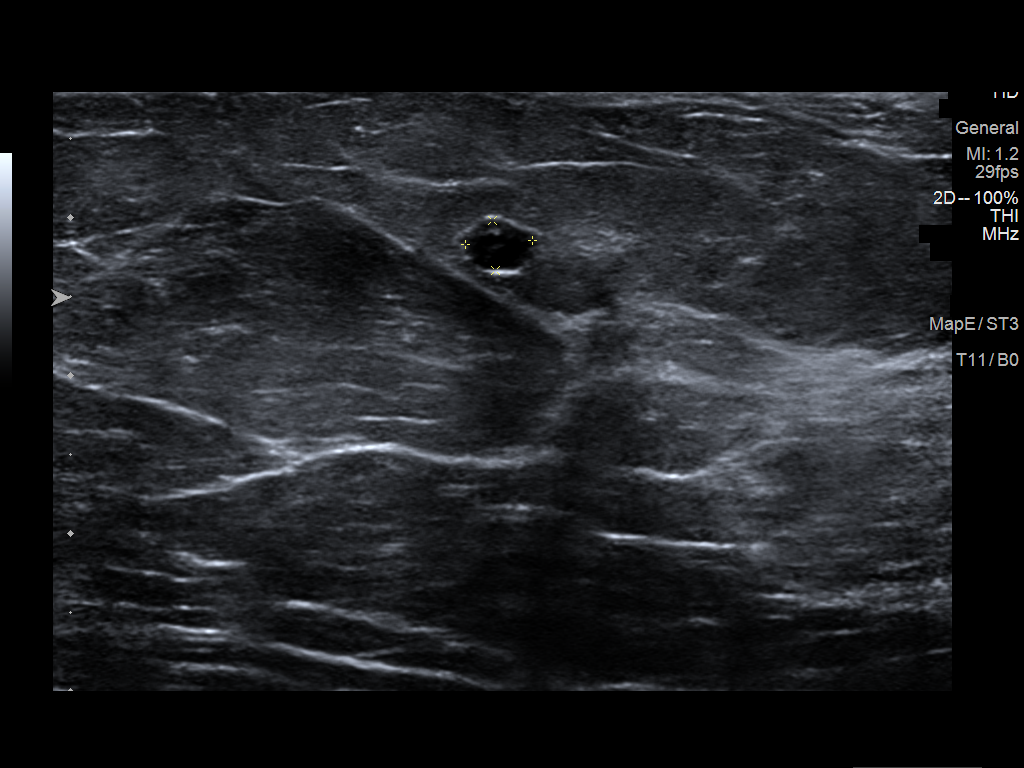
[im 3/5]
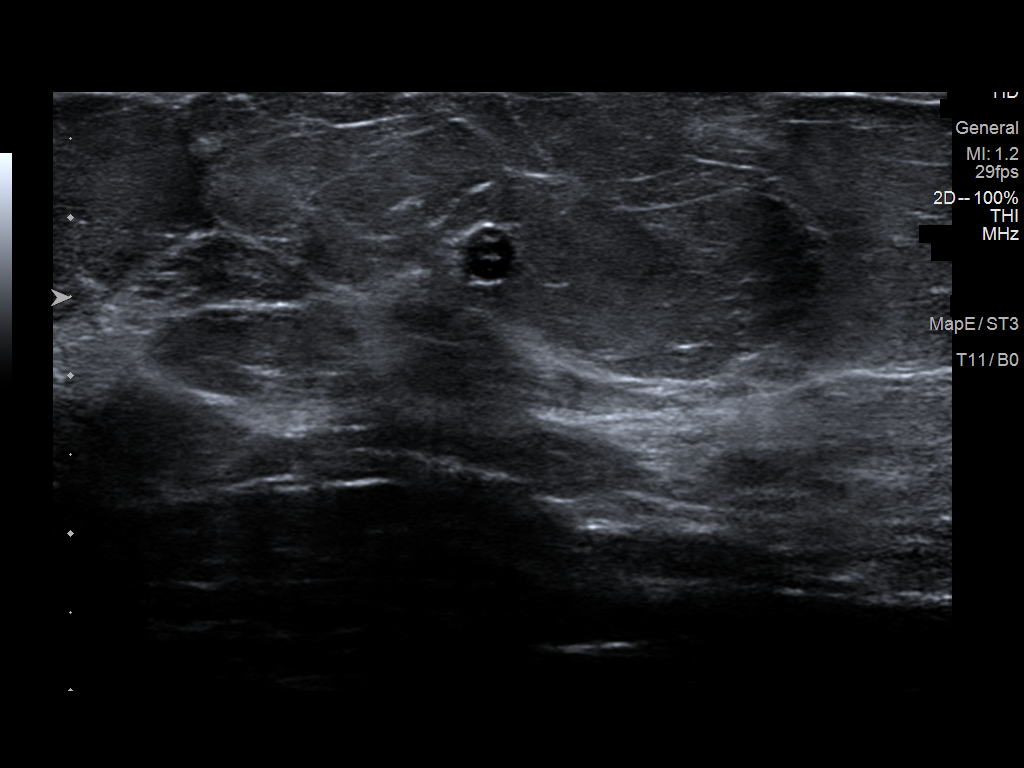
[im 4/5]
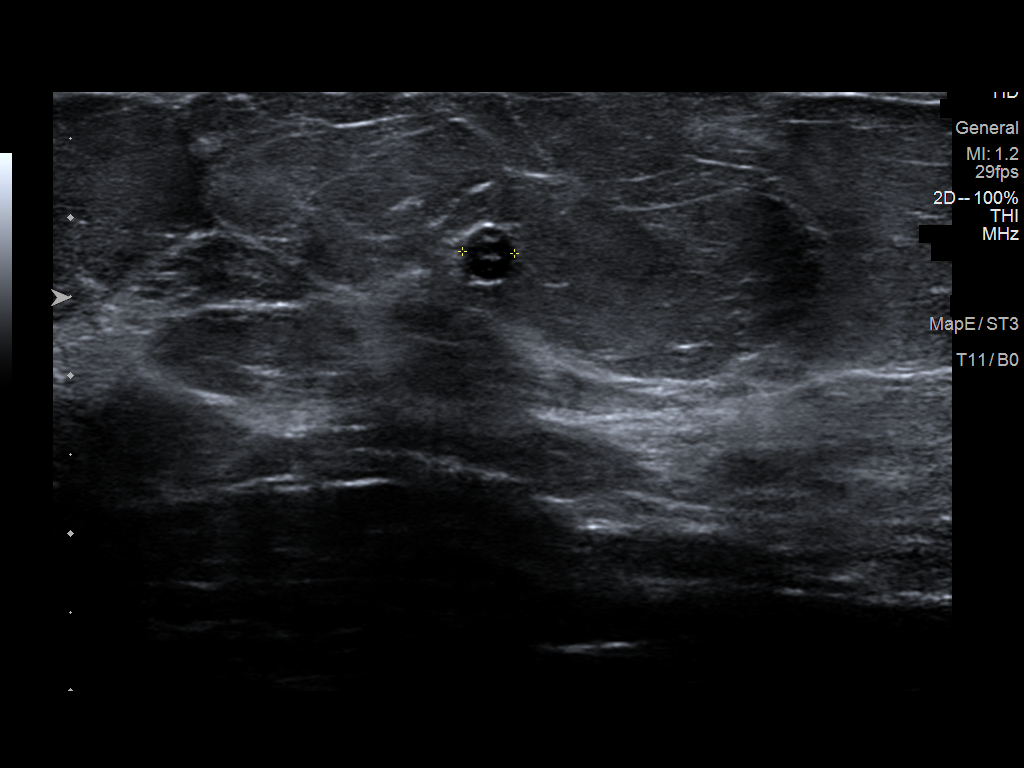
[im 5/5]
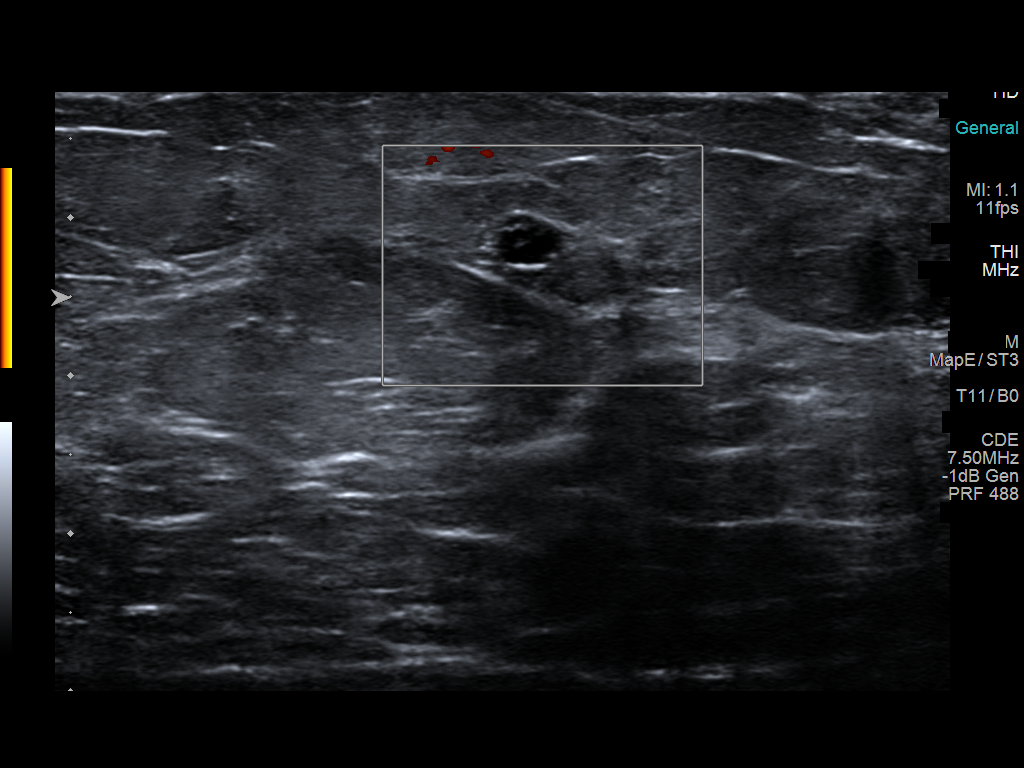

[5 of 5 positions shown; findings below may reference images not displayed]

ACR Breast Density Category b: There are scattered areas of
fibroglandular density.
FINDINGS: Spot compression CC and MLO tomograms were performed over the
upper-outer right breast. There is a circumscribed oval mass in the
upper-outer right breast measuring approximately 0.3 cm.

Mammographic images were processed with CAD.

Targeted ultrasound of the right breast was performed. There is a
cyst at 10 o'clock 7 cm from nipple measuring 0.4 x 0.3 x 0.3 cm.
This corresponds well with mammography findings.
IMPRESSION: No findings of malignancy in the right breast.

RECOMMENDATION:
Screening mammogram in one year.(Code:CX-7-A4F)

I have discussed the findings and recommendations with the patient.
If applicable, a reminder letter will be sent to the patient
regarding the next appointment.

BI-RADS CATEGORY  2: Benign.

## 2020-02-05 NOTE — Progress Notes (Signed)
    Stab Phlebectomy Procedure  Erica Buckley DOB:07-29-1940  02/05/2020  Consent signed: Yes  Surgeon:C. Edilia Bo, MD  Procedure: stab phlebectomy: right leg  BP 117/74 (BP Location: Left Arm, Patient Position: Sitting, Cuff Size: Large)   Pulse 74   Temp 97.9 F (36.6 C) (Temporal)   Resp 16   Ht 5\' 6"  (1.676 m)   Wt 219 lb (99.3 kg)   SpO2 98%   BMI 35.35 kg/m   Start time: 11:00 AM   End time: 12:00 PM    Tumescent Anesthesia: 200 cc 0.9% NaCl with 50 cc Lidocaine HCL with 1% Epi and 15 cc 8.4% NaHCO3  Local Anesthesia: 5 cc Lidocaine HCL and NaHCO3 (ratio 2:1)    Stab Phlebectomy: 10-20 Sites: Calf  Patient tolerated procedure well: Yes  Notes: Erica Buckley wore facial mask. All staff members wore facial mask and goggles.  Erica Buckley took Ativan 1 mg two tablets at 9:00 AM 0n 02-05-2020.  Description of Procedure:  After marking the course of the secondary varicosities, the patient was placed on the operating table in the prone position, and the right leg was prepped and draped in sterile fashion.    The patient was then put into Trendelenburg position.  Local anesthetic was administered at the previously marked varicosities, and tumescent anesthesia was administered around the vessels.  Ten to 20 stab wounds were made using the tip of an 11 blade. And using the vein hook, the phlebectomies were performed using a hemostat to avulse the varicosities.  Adequate hemostasis was achieved, and steri strips were applied to the stab wound.      ABD pads and thigh high compression stockings were applied as well ace wraps where needed. Blood loss was less than 15 cc.  The patient ambulated out of the operating room having tolerated the procedure well.

## 2020-02-05 NOTE — Progress Notes (Signed)
Patient name: Harrietta Incorvaia Umphlett MRN: [id number] DOB: Oct 23, 1940 Sex: female  REASON FOR VISIT: For laser ablation of the right small saphenous vein in 10-20 stabs  HPI: Iretha Kirley Krotz is a 80 y.o. female with CEAP C4a venous disease.  She is failed conservative treatment and presents for laser ablation of the right small saphenous vein with 10-20 stabs.  Of note from a technical standpoint the vein is very tortuous and I explained that we may have some difficulty getting through the vein because of the tortuosity.  I felt that I could cannulate the vein in the proximal calf.  Above that the vein did become somewhat tortuous.  Although I could clearly see the saphenous popliteal junction.  Current Outpatient Medications  Medication Sig Dispense Refill  . B Complex-C (B-COMPLEX WITH VITAMIN C) tablet Take 1 tablet by mouth every other day.     . diclofenac sodium (VOLTAREN) 1 % GEL Apply 2 g topically 2 (two) times daily.    . empagliflozin (JARDIANCE) 10 MG TABS tablet Take 10 mg by mouth daily.    . folic acid (FOLVITE) 400 MCG tablet Take 400 mcg by mouth every Monday, Wednesday, and Friday.    Marland Kitchen ibuprofen (ADVIL,MOTRIN) 800 MG tablet Take 800 mg by mouth every 8 (eight) hours as needed for moderate pain.    Marland Kitchen LORazepam (ATIVAN) 1 MG tablet Take 2 tablets 30 minutes prior to leaving house on day of procedure. 2 tablet 0  . losartan-hydrochlorothiazide (HYZAAR) 50-12.5 MG per tablet Take 1 tablet by mouth daily.    . metoprolol succinate (TOPROL-XL) 25 MG 24 hr tablet Take 25 mg by mouth daily.    . ondansetron (ZOFRAN) 4 MG tablet Take 1 tablet (4 mg total) by mouth every 6 (six) hours as needed for nausea. 12 tablet 0  . TRADJENTA 5 MG TABS tablet     . traMADol (ULTRAM) 50 MG tablet Take 1 tablet (50 mg total) by mouth every 6 (six) hours as needed. 10 tablet 0   No current facility-administered medications for this visit.    PHYSICAL EXAM: Vitals:   02/05/20 1030  BP:  117/74  Pulse: 74  Resp: 16  Temp: 97.9 F (36.6 C)  TempSrc: Temporal  SpO2: 98%  Weight: 219 lb (99.3 kg)  Height: 5\' 6"  (1.676 m)    PROCEDURE: 10-20 stab phlebectomies  TECHNIQUE: The patient was taken to the exam room and the dilated veins were marked with the patient standing.  The patient was then placed prone.  I looked at the small saphenous vein myself with the SonoSite and again I felt I could cannulate this in the proximal calf however, there was significant tortuous segments and I was concerned I would not be able to traverse the segments.  However I felt it was worth trying.  The right leg was prepped and draped in usual sterile fashion.  Under ultrasound guidance, after the skin was anesthetized, I was able to cannulate the small saphenous vein in the proximal calf and got good return.  The wire advanced approximately 3 cm before not been able to advance this any further.  I was able to advance the sheath into the small saphenous vein.  I then put a small curve at the end of the wire and tried multiple times to advance through the tortuosity but was ultimately not successful and elected not to proceed with laser ablation.  Tumescent anesthesia was administered under all the marked areas.  Using approximately  15 small stab incisions the vein was clipped and then brought above the skin grasped with a hemostat and then bluntly excised.  Pressure was held for hemostasis.  Pressure dressing was applied.  The patient tolerated the procedure well.  Waverly Ferrari [facility] [phone]

## 2020-02-12 ENCOUNTER — Encounter (HOSPITAL_COMMUNITY): Payer: Medicare HMO

## 2020-02-12 ENCOUNTER — Ambulatory Visit: Payer: Medicare HMO | Admitting: Vascular Surgery

## 2020-02-12 ENCOUNTER — Other Ambulatory Visit: Payer: Self-pay | Admitting: Family Medicine

## 2020-02-12 DIAGNOSIS — Z1231 Encounter for screening mammogram for malignant neoplasm of breast: Secondary | ICD-10-CM

## 2020-02-24 DIAGNOSIS — E1169 Type 2 diabetes mellitus with other specified complication: Secondary | ICD-10-CM | POA: Diagnosis not present

## 2020-02-24 DIAGNOSIS — I10 Essential (primary) hypertension: Secondary | ICD-10-CM | POA: Diagnosis not present

## 2020-02-24 DIAGNOSIS — M1712 Unilateral primary osteoarthritis, left knee: Secondary | ICD-10-CM | POA: Diagnosis not present

## 2020-02-28 DIAGNOSIS — Z03818 Encounter for observation for suspected exposure to other biological agents ruled out: Secondary | ICD-10-CM | POA: Diagnosis not present

## 2020-02-28 DIAGNOSIS — Z20822 Contact with and (suspected) exposure to covid-19: Secondary | ICD-10-CM | POA: Diagnosis not present

## 2020-03-18 DIAGNOSIS — I10 Essential (primary) hypertension: Secondary | ICD-10-CM | POA: Diagnosis not present

## 2020-03-18 DIAGNOSIS — E1169 Type 2 diabetes mellitus with other specified complication: Secondary | ICD-10-CM | POA: Diagnosis not present

## 2020-03-18 DIAGNOSIS — M1712 Unilateral primary osteoarthritis, left knee: Secondary | ICD-10-CM | POA: Diagnosis not present

## 2020-03-31 DIAGNOSIS — N644 Mastodynia: Secondary | ICD-10-CM | POA: Diagnosis not present

## 2020-04-01 ENCOUNTER — Other Ambulatory Visit: Payer: Self-pay | Admitting: Family Medicine

## 2020-04-01 DIAGNOSIS — N644 Mastodynia: Secondary | ICD-10-CM

## 2020-04-01 IMAGING — MR MR MRA HEAD W/O CM
1 series · 24 of 48 positions shown · non-contrast
Comparison: None.

CLINICAL DATA: Pulsatile tinnitus

EXAM:
MRA HEAD WITHOUT CONTRAST
TECHNIQUE: Angiographic images of the Circle of Willis were obtained using MRA
technique without intravenous contrast.

[Series 3: [id number]_multi-slab · axial · 0.7mm · 0.35mm/px · z∈[-37,+57]mm · 24 of 143 slices shown]
[im 1/143]
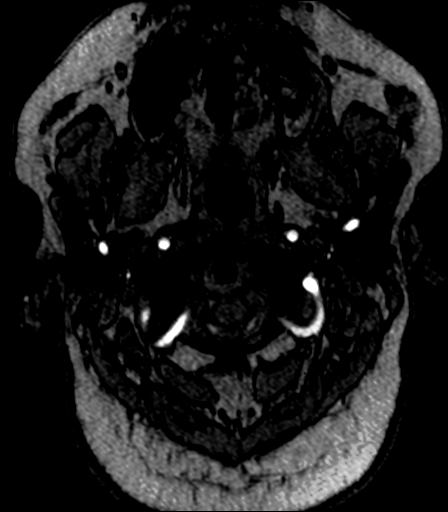
[im 4/143]
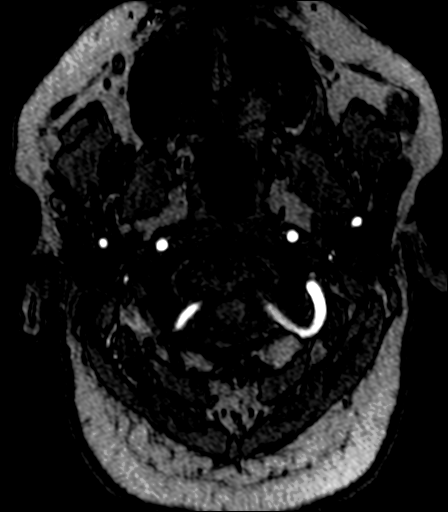
[im 7/143]
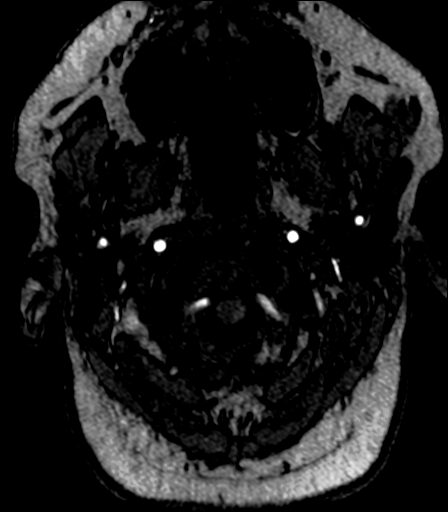
[im 10/143]
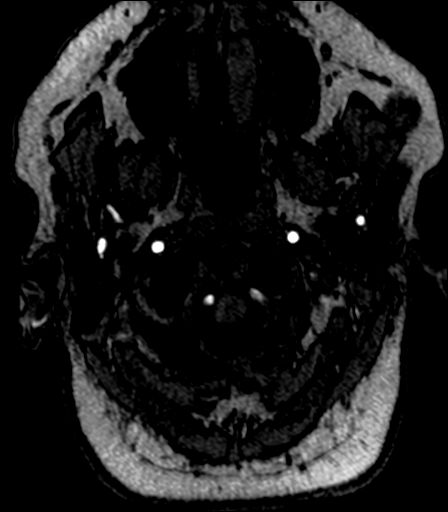
[im 13/143]
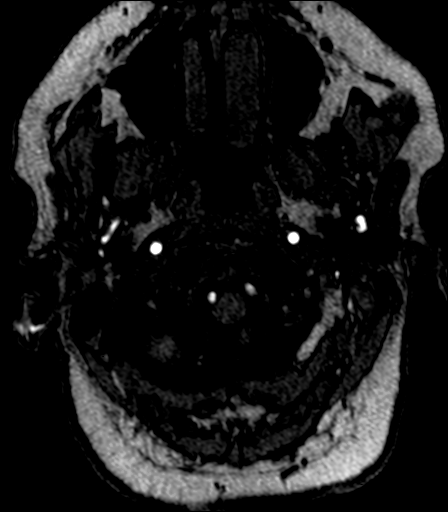
[im 16/143]
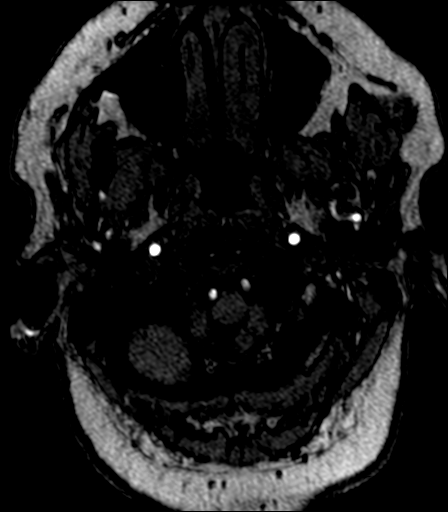
[im 19/143]
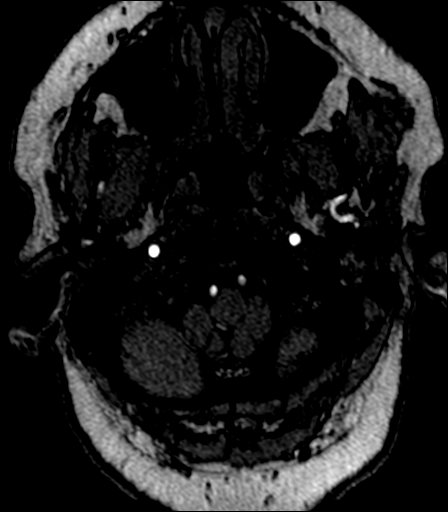
[im 22/143]
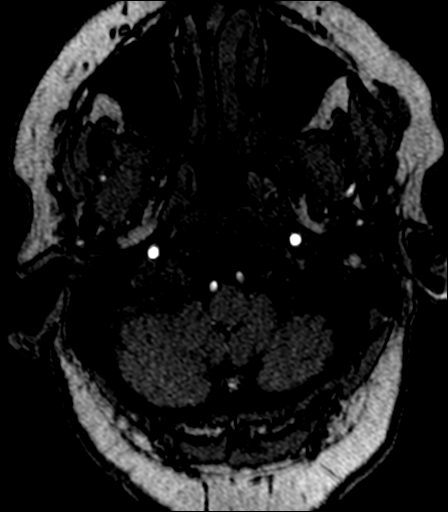
[im 25/143]
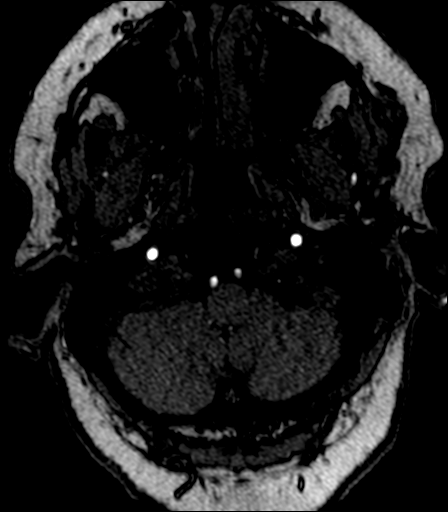
[im 28/143]
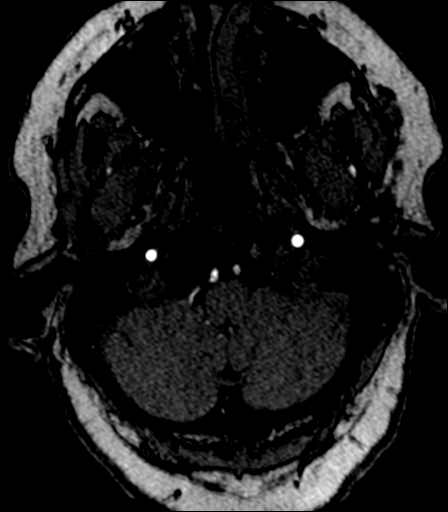
[im 31/143]
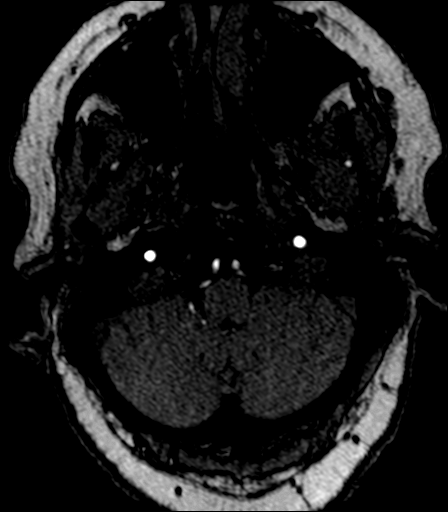
[im 34/143]
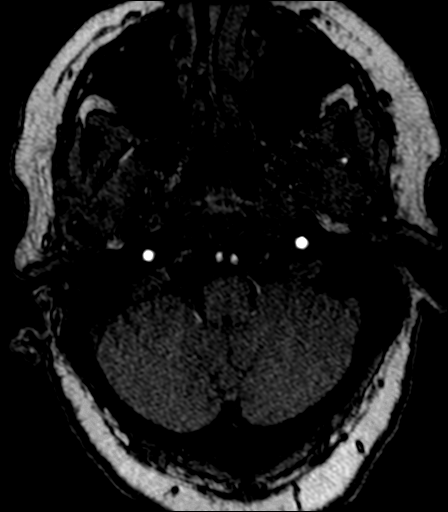
[im 37/143]
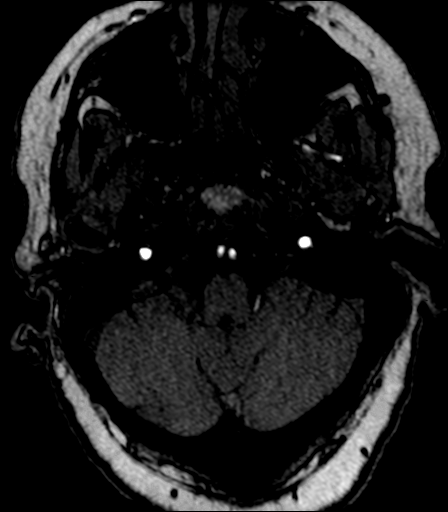
[im 40/143]
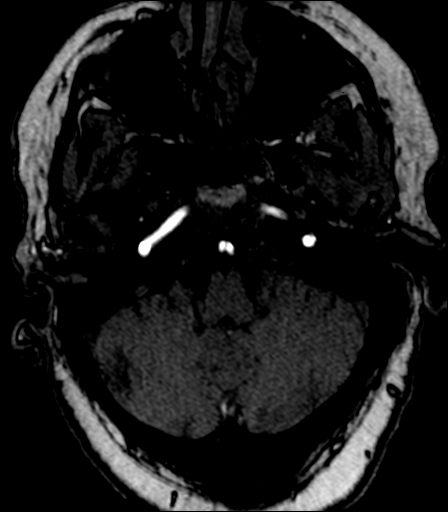
[im 43/143]
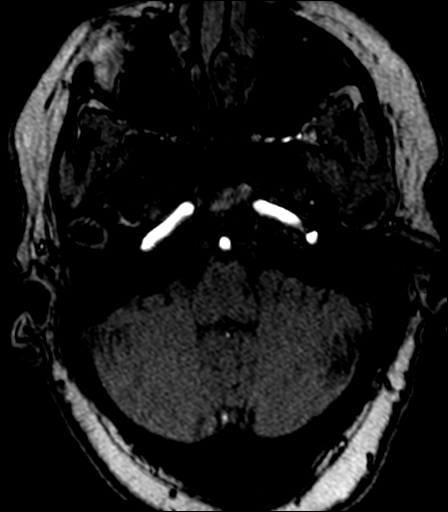
[im 46/143]
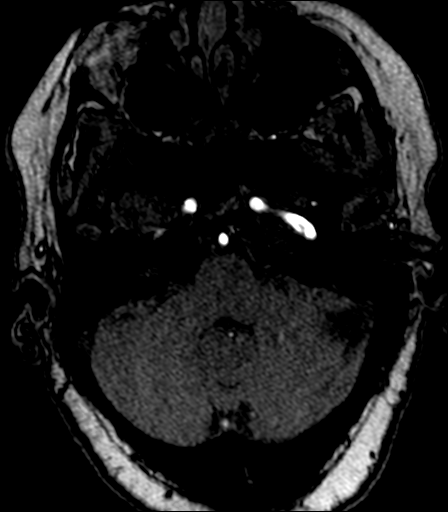
[im 49/143]
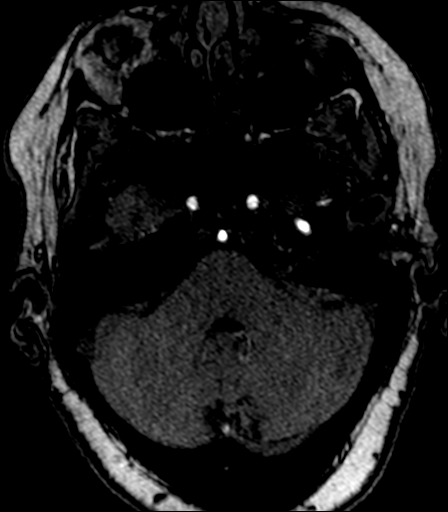
[im 64/143]
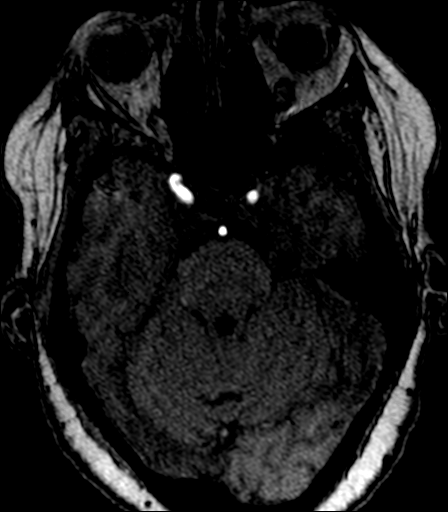
[im 73/143]
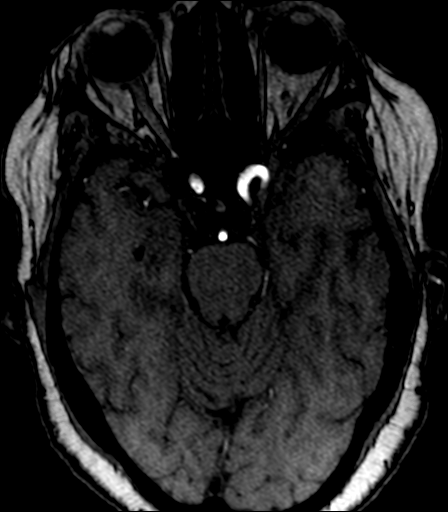
[im 82/143]
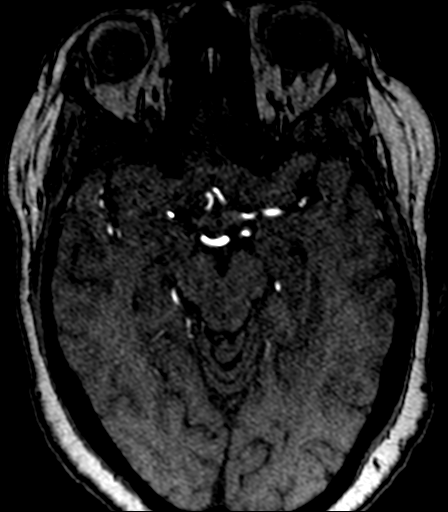
[im 100/143]
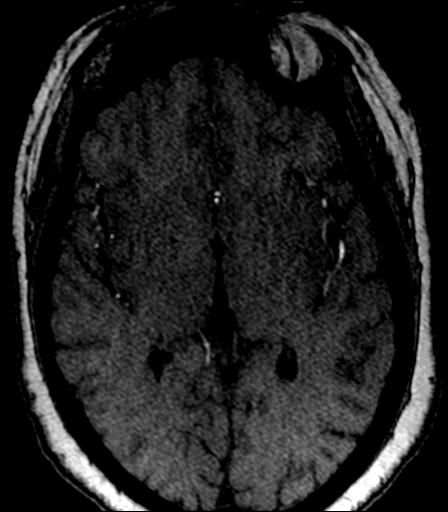
[im 118/143]
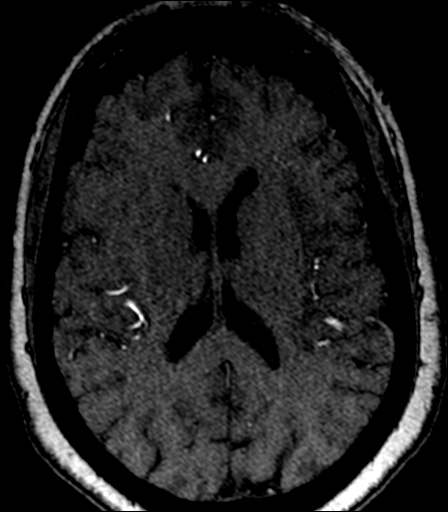
[im 121/143]
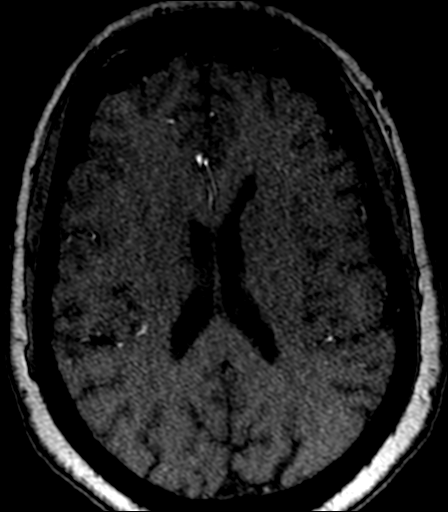
[im 136/143]
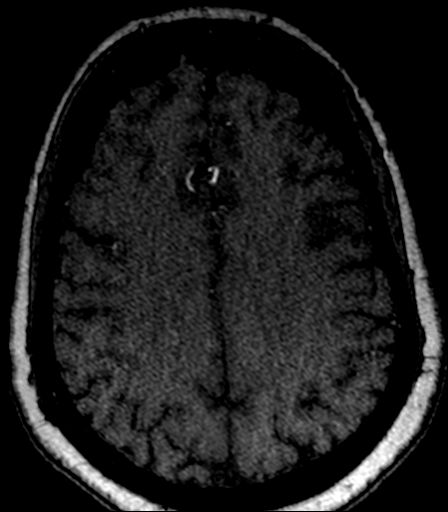

[24 of 48 positions shown; findings below may reference images not displayed]

FINDINGS: Intracranial internal carotid arteries are patent. Middle and
anterior cerebral arteries are patent. Intracranial vertebral
arteries, basilar artery, posterior cerebral arteries are patent.
There is no significant stenosis or aneurysm.
IMPRESSION: Normal MRA of the head.

## 2020-04-21 DIAGNOSIS — R1032 Left lower quadrant pain: Secondary | ICD-10-CM | POA: Diagnosis not present

## 2020-04-21 DIAGNOSIS — K5732 Diverticulitis of large intestine without perforation or abscess without bleeding: Secondary | ICD-10-CM | POA: Diagnosis not present

## 2020-04-22 ENCOUNTER — Encounter (HOSPITAL_COMMUNITY): Payer: Medicare HMO

## 2020-04-22 ENCOUNTER — Ambulatory Visit: Payer: Medicare HMO | Admitting: Vascular Surgery

## 2020-05-04 ENCOUNTER — Other Ambulatory Visit: Payer: Self-pay

## 2020-05-04 NOTE — Patient Instructions (Signed)
Goals    . Monitor and Manage My Blood Sugar     Timeframe:  Long-Range Goal Priority:  High Start Date:  02/03/20                           Expected End Date:     09/29/20             Follow Up Date  08/29/20   - check blood sugar at prescribed times - check blood sugar if I feel it is too high or too low - take the blood sugar meter to all doctor visits    Why is this important?   Checking your blood sugar at home helps to keep it from getting very high or very low.  Writing the results in a diary or log helps the doctor know how to care for you.  Your blood sugar log should have the time, date and the results.  Also, write down the amount of insulin or other medicine that you take.  Other information, like what you ate, exercise done and how you were feeling, will also be helpful.     Notes: Take medications as prescribed and continue to monitor sugars.   05/04/20 Keep up the great work!!

## 2020-05-04 NOTE — Patient Outreach (Signed)
[facility]) Care Management  [facility] Care Manager  05/04/2020   Erica Buckley [birth year] [id number]  Subjective: Telephone call to patient for disease management follow up. Patient having some trouble with diverticulitis.  To see GI on 05/07/20.  Patient blood sugars less than 100.  Discussed diabetes management.  She verbalized understanding.    Objective:   Encounter Medications:  Outpatient Encounter Medications as of 05/04/2020  Medication Sig Note  . B Complex-C (B-COMPLEX WITH VITAMIN C) tablet Take 1 tablet by mouth every other day.    . diclofenac sodium (VOLTAREN) 1 % GEL Apply 2 g topically 2 (two) times daily.   . empagliflozin (JARDIANCE) 10 MG TABS tablet Take 10 mg by mouth daily.   . folic acid (FOLVITE) 400 MCG tablet Take 400 mcg by mouth every Monday, Wednesday, and Friday.   Marland Kitchen ibuprofen (ADVIL,MOTRIN) 800 MG tablet Take 800 mg by mouth every 8 (eight) hours as needed for moderate pain.   Marland Kitchen LORazepam (ATIVAN) 1 MG tablet Take 2 tablets 30 minutes prior to leaving house on day of procedure.   Marland Kitchen losartan-hydrochlorothiazide (HYZAAR) 50-12.5 MG per tablet Take 1 tablet by mouth daily.   . metoprolol succinate (TOPROL-XL) 25 MG 24 hr tablet Take 25 mg by mouth daily.   . ondansetron (ZOFRAN) 4 MG tablet Take 1 tablet (4 mg total) by mouth every 6 (six) hours as needed for nausea.   . TRADJENTA 5 MG TABS tablet  02/03/2020: One tablet per day  . traMADol (ULTRAM) 50 MG tablet Take 1 tablet (50 mg total) by mouth every 6 (six) hours as needed.    No facility-administered encounter medications on file as of 05/04/2020.    Functional Status:  In your present state of health, do you have any difficulty performing the following activities: 11/04/2019  Hearing? N  Vision? N  Difficulty concentrating or making decisions? N  Walking or climbing stairs? N  Dressing or bathing? N  Doing errands, shopping? N  Preparing Food and eating ? N  Using the Toilet? N   In the past six months, have you accidently leaked urine? N  Do you have problems with loss of bowel control? N  Managing your Medications? N  Managing your Finances? N  Housekeeping or managing your Housekeeping? N  Some recent data might be hidden    Fall/Depression Screening: Fall Risk  02/03/2020 11/04/2019 02/19/2019  Falls in the past year? 0 0 0  Number falls in past yr: 0 - -  Injury with Fall? 0 - -   PHQ 2/9 Scores 02/03/2020 11/04/2019 11/11/2018 10/30/2018  PHQ - 2 Score 0 0 0 0    Assessment:  Goals Addressed            This Visit's Progress   . Monitor and Manage My Blood Sugar   On track    Timeframe:  Long-Range Goal Priority:  High Start Date:  02/03/20                           Expected End Date:     09/29/20             Follow Up Date  08/29/20   - check blood sugar at prescribed times - check blood sugar if I feel it is too high or too low - take the blood sugar meter to all doctor visits    Why is this important?   Checking your blood  sugar at home helps to keep it from getting very high or very low.  Writing the results in a diary or log helps the doctor know how to care for you.  Your blood sugar log should have the time, date and the results.  Also, write down the amount of insulin or other medicine that you take.  Other information, like what you ate, exercise done and how you were feeling, will also be helpful.     Notes: Take medications as prescribed and continue to monitor sugars.   05/04/20 Keep up the great work!!       Plan: RN CM will follow up in July.  Follow-up:  Patient agrees to Care Plan and Follow-up.   Bary Leriche, RN, MSN [facility] Care Management Care Management Coordinator Direct Line [phone] Cell [phone] Toll Free: [phone]  Fax: [phone]

## 2020-05-07 DIAGNOSIS — K5900 Constipation, unspecified: Secondary | ICD-10-CM | POA: Diagnosis not present

## 2020-05-07 DIAGNOSIS — Z800 Family history of malignant neoplasm of digestive organs: Secondary | ICD-10-CM | POA: Diagnosis not present

## 2020-05-07 DIAGNOSIS — K921 Melena: Secondary | ICD-10-CM | POA: Diagnosis not present

## 2020-05-07 DIAGNOSIS — K219 Gastro-esophageal reflux disease without esophagitis: Secondary | ICD-10-CM | POA: Diagnosis not present

## 2020-05-07 DIAGNOSIS — R1030 Lower abdominal pain, unspecified: Secondary | ICD-10-CM | POA: Diagnosis not present

## 2020-05-14 ENCOUNTER — Other Ambulatory Visit: Payer: Medicare HMO

## 2020-05-28 DIAGNOSIS — E1169 Type 2 diabetes mellitus with other specified complication: Secondary | ICD-10-CM | POA: Diagnosis not present

## 2020-06-01 ENCOUNTER — Other Ambulatory Visit: Payer: Self-pay

## 2020-06-01 ENCOUNTER — Ambulatory Visit: Payer: Medicare HMO

## 2020-06-01 ENCOUNTER — Ambulatory Visit
Admission: RE | Admit: 2020-06-01 | Discharge: 2020-06-01 | Disposition: A | Discharge status: Home / Self Care | Payer: Medicare HMO | Source: Ambulatory Visit | Attending: Family Medicine | Admitting: Family Medicine

## 2020-06-01 DIAGNOSIS — R922 Inconclusive mammogram: Secondary | ICD-10-CM | POA: Diagnosis not present

## 2020-06-01 DIAGNOSIS — N644 Mastodynia: Secondary | ICD-10-CM | POA: Diagnosis not present

## 2020-06-14 DIAGNOSIS — R1030 Lower abdominal pain, unspecified: Secondary | ICD-10-CM | POA: Diagnosis not present

## 2020-06-14 DIAGNOSIS — K219 Gastro-esophageal reflux disease without esophagitis: Secondary | ICD-10-CM | POA: Diagnosis not present

## 2020-06-14 DIAGNOSIS — K921 Melena: Secondary | ICD-10-CM | POA: Diagnosis not present

## 2020-06-14 DIAGNOSIS — K5900 Constipation, unspecified: Secondary | ICD-10-CM | POA: Diagnosis not present

## 2020-07-06 DIAGNOSIS — E1169 Type 2 diabetes mellitus with other specified complication: Secondary | ICD-10-CM | POA: Diagnosis not present

## 2020-07-06 DIAGNOSIS — M1712 Unilateral primary osteoarthritis, left knee: Secondary | ICD-10-CM | POA: Diagnosis not present

## 2020-07-06 DIAGNOSIS — K219 Gastro-esophageal reflux disease without esophagitis: Secondary | ICD-10-CM | POA: Diagnosis not present

## 2020-07-06 DIAGNOSIS — I10 Essential (primary) hypertension: Secondary | ICD-10-CM | POA: Diagnosis not present

## 2020-08-03 ENCOUNTER — Other Ambulatory Visit: Payer: Self-pay

## 2020-08-03 NOTE — Patient Instructions (Signed)
Goals Addressed             This Visit's Progress    Monitor and Manage My Blood Sugar   On track    Barriers: Health Behaviors Timeframe:  Long-Range Goal Priority:  High Start Date:  02/03/20                           Expected End Date:     01/29/21             Follow Up Date  11/29/20   - check blood sugar at prescribed times - check blood sugar if I feel it is too high or too low - take the blood sugar meter to all doctor visits    Why is this important?   Checking your blood sugar at home helps to keep it from getting very high or very low.  Writing the results in a diary or log helps the doctor know how to care for you.  Your blood sugar log should have the time, date and the results.  Also, write down the amount of insulin or other medicine that you take.  Other information, like what you ate, exercise done and how you were feeling, will also be helpful.     Notes: Take medications as prescribed and continue to monitor sugars.   05/04/20 Keep up the great work!! 08/03/20 Keep monitoring sugars.   Diabetes Management Discussed: Medication adherence Reviewed importance of limiting carbs such as rice, potatoes, breads, and pastas. Also discussed limiting sweets and sugary drinks.  Discussed importance of portion control.  Also discussed importance of annual exams, foot exams, and eye exams.

## 2020-08-03 NOTE — Patient Outreach (Signed)
[facility]) Care Management  [facility] Care Manager  08/03/2020   Erica Buckley October 11, 1940 [id number]  Subjective: Telephone call to patient for disease management follow up. She reports doing well. Helping with taking care of her mother who has been sick.  Blood sugars less than 120 per patient. Discussed diabetes management. She verbalized understanding.   Objective:   Encounter Medications:  Outpatient Encounter Medications as of 08/03/2020  Medication Sig Note   B Complex-C (B-COMPLEX WITH VITAMIN C) tablet Take 1 tablet by mouth every other day.     diclofenac sodium (VOLTAREN) 1 % GEL Apply 2 g topically 2 (two) times daily.    empagliflozin (JARDIANCE) 10 MG TABS tablet Take 10 mg by mouth daily.    folic acid (FOLVITE) 400 MCG tablet Take 400 mcg by mouth every Monday, Wednesday, and Friday.    ibuprofen (ADVIL,MOTRIN) 800 MG tablet Take 800 mg by mouth every 8 (eight) hours as needed for moderate pain.    LORazepam (ATIVAN) 1 MG tablet Take 2 tablets 30 minutes prior to leaving house on day of procedure.    losartan-hydrochlorothiazide (HYZAAR) 50-12.5 MG per tablet Take 1 tablet by mouth daily.    metoprolol succinate (TOPROL-XL) 25 MG 24 hr tablet Take 25 mg by mouth daily.    ondansetron (ZOFRAN) 4 MG tablet Take 1 tablet (4 mg total) by mouth every 6 (six) hours as needed for nausea.    TRADJENTA 5 MG TABS tablet  02/03/2020: One tablet per day   traMADol (ULTRAM) 50 MG tablet Take 1 tablet (50 mg total) by mouth every 6 (six) hours as needed.    No facility-administered encounter medications on file as of 08/03/2020.    Functional Status:  In your present state of health, do you have any difficulty performing the following activities: 11/04/2019  Hearing? N  Vision? N  Difficulty concentrating or making decisions? N  Walking or climbing stairs? N  Dressing or bathing? N  Doing errands, shopping? N  Preparing Food and eating ? N  Using the Toilet?  N  In the past six months, have you accidently leaked urine? N  Do you have problems with loss of bowel control? N  Managing your Medications? N  Managing your Finances? N  Housekeeping or managing your Housekeeping? N  Some recent data might be hidden    Fall/Depression Screening: Fall Risk  02/03/2020 11/04/2019 02/19/2019  Falls in the past year? 0 0 0  Number falls in past yr: 0 - -  Injury with Fall? 0 - -   PHQ 2/9 Scores 02/03/2020 11/04/2019 11/11/2018 10/30/2018  PHQ - 2 Score 0 0 0 0    Assessment:   Care Plan Care Plan : Diabetes Type 2 (Adult)  Updates made by Fleeta Emmer, RN since 08/03/2020 12:00 AM     Problem: Disease Progression (Diabetes, Type 2)      Long-Range Goal: Disease Progression Prevented or Minimized as evidenced by blood sugar less than 150.   Start Date: 11/04/2019  Expected End Date: 01/29/2021  This Visit's Progress: On track  Recent Progress: On track  Priority: High  Note:   Evidence-based guidance:  Anticipate A1C testing (point-of-care) every 3 to 6 months based on goal  attainment. Promote self-monitoring of blood glucose levels  Notes:    Task: Monitor and Manage Follow-up for Comorbidities   Due Date: 01/29/2021  Priority: Routine  Responsible User: Fleeta Emmer, RN  Note:   Care Management Activities:    -  healthy lifestyle promoted - response to pharmacologic therapy monitored    Notes: 05/03/20 Patient Continues daily sugar monitoring.  Blood sugar less than 100 per patient.    08/03/20 Patient continues sugar monitoring.  Blood sugar 100-120.  Diabetes Management Discussed: Medication adherence Reviewed importance of limiting carbs such as rice, potatoes, breads, and pastas. Also discussed limiting sweets and sugary drinks.  Discussed importance of portion control.  Also discussed importance of annual exams, foot exams, and eye exams.         Goals Addressed             This Visit's Progress    Monitor and Manage My  Blood Sugar   On track    Barriers: Health Behaviors Timeframe:  Long-Range Goal Priority:  High Start Date:  02/03/20                           Expected End Date:     01/29/21             Follow Up Date  11/29/20   - check blood sugar at prescribed times - check blood sugar if I feel it is too high or too low - take the blood sugar meter to all doctor visits    Why is this important?   Checking your blood sugar at home helps to keep it from getting very high or very low.  Writing the results in a diary or log helps the doctor know how to care for you.  Your blood sugar log should have the time, date and the results.  Also, write down the amount of insulin or other medicine that you take.  Other information, like what you ate, exercise done and how you were feeling, will also be helpful.     Notes: Take medications as prescribed and continue to monitor sugars.   05/04/20 Keep up the great work!! 08/03/20 Keep monitoring sugars.   Diabetes Management Discussed: Medication adherence Reviewed importance of limiting carbs such as rice, potatoes, breads, and pastas. Also discussed limiting sweets and sugary drinks.  Discussed importance of portion control.  Also discussed importance of annual exams, foot exams, and eye exams.            Plan:  Follow-up: Patient agrees to Care Plan and Follow-up. Follow-up in 3 month(s)   Bary Leriche, RN, MSN [facility] Care Management Care Management Coordinator Direct Line [phone] Cell [phone] Toll Free: [phone]  Fax: [phone]

## 2020-08-04 DIAGNOSIS — Z1389 Encounter for screening for other disorder: Secondary | ICD-10-CM | POA: Diagnosis not present

## 2020-08-04 DIAGNOSIS — Z7984 Long term (current) use of oral hypoglycemic drugs: Secondary | ICD-10-CM | POA: Diagnosis not present

## 2020-08-04 DIAGNOSIS — E2839 Other primary ovarian failure: Secondary | ICD-10-CM | POA: Diagnosis not present

## 2020-08-04 DIAGNOSIS — Z23 Encounter for immunization: Secondary | ICD-10-CM | POA: Diagnosis not present

## 2020-08-04 DIAGNOSIS — E1169 Type 2 diabetes mellitus with other specified complication: Secondary | ICD-10-CM | POA: Diagnosis not present

## 2020-08-04 DIAGNOSIS — Z0000 Encounter for general adult medical examination without abnormal findings: Secondary | ICD-10-CM | POA: Diagnosis not present

## 2020-08-04 DIAGNOSIS — R519 Headache, unspecified: Secondary | ICD-10-CM | POA: Diagnosis not present

## 2020-08-04 DIAGNOSIS — I10 Essential (primary) hypertension: Secondary | ICD-10-CM | POA: Diagnosis not present

## 2020-08-09 ENCOUNTER — Other Ambulatory Visit: Payer: Self-pay | Admitting: Family Medicine

## 2020-08-09 DIAGNOSIS — E2839 Other primary ovarian failure: Secondary | ICD-10-CM

## 2020-08-25 DIAGNOSIS — R21 Rash and other nonspecific skin eruption: Secondary | ICD-10-CM | POA: Diagnosis not present

## 2020-08-27 DIAGNOSIS — E1169 Type 2 diabetes mellitus with other specified complication: Secondary | ICD-10-CM | POA: Diagnosis not present

## 2020-10-27 ENCOUNTER — Encounter: Payer: Self-pay | Admitting: *Deleted

## 2020-11-01 ENCOUNTER — Ambulatory Visit: Payer: Medicare HMO | Admitting: Neurology

## 2020-11-01 ENCOUNTER — Ambulatory Visit: Payer: Medicare HMO | Admitting: Diagnostic Neuroimaging

## 2020-11-01 ENCOUNTER — Encounter: Payer: Self-pay | Admitting: Diagnostic Neuroimaging

## 2020-11-01 VITALS — BP 130/68 | HR 66 | Ht 66.0 in | Wt 226.2 lb

## 2020-11-01 DIAGNOSIS — R42 Dizziness and giddiness: Secondary | ICD-10-CM

## 2020-11-01 DIAGNOSIS — R110 Nausea: Secondary | ICD-10-CM

## 2020-11-01 DIAGNOSIS — R519 Headache, unspecified: Secondary | ICD-10-CM

## 2020-11-01 DIAGNOSIS — H5712 Ocular pain, left eye: Secondary | ICD-10-CM

## 2020-11-01 NOTE — Patient Instructions (Signed)
  LEFT SIDED HEAD SENSATIONS / LEFT EYE PAIN / NAUSEA / VERTIGO - check MRI brain and labs - if negative, then could represent migraine phenomenon in setting of increased stress

## 2020-11-01 NOTE — Progress Notes (Signed)
[facility]  PATIENT: Erica Buckley DOB: Jan 05, 1941  REFERRING CLINICIAN: Harlan Stains, MD HISTORY FROM: patient  REASON FOR VISIT: new consult    HISTORICAL  CHIEF COMPLAINT:  Chief Complaint  Patient presents with   Recurrent head sensations    Rm 7 New Pt  "for last 3 months I have been feeling something rising then goes down in my head then I get a sharp pain over my left eye, happens daily"    HISTORY OF PRESENT ILLNESS:   80 year old female here for evaluation of abnormal head sensations.  For past 3 to 6 months patient has had onset of left-sided abnormal sensation in the head, with an opening closing sensation, rising sensation, then sharp pain in the left eye.  Episodes happening several times per day.  Sometimes she has these on a daily basis sometimes she can go 1 to 2 weeks without attacks.  She has some intermittent nausea.  Has been remittent vertigo.  Has been under more stress lately related to her 34 year old mother with dementia and declining health.  Patient has remote history of migraine in the 1970s when she was going through a divorce.  She had severe headaches with photophobia, nausea.   REVIEW OF SYSTEMS: Full 14 system review of systems performed and negative with exception of: as per HPI.  ALLERGIES: Allergies  Allergen Reactions   Codeine Shortness Of Breath    vomiting   Diovan [Valsartan] Cough    Increased stool   Other     No blood products, jehovah witness   Pioglitazone Other (See Comments)    Wgt gain, gas, bloating    HOME MEDICATIONS: Outpatient Medications Prior to Visit  Medication Sig Dispense Refill   B Complex-C (B-COMPLEX WITH VITAMIN C) tablet Take 1 tablet by mouth every other day.      diclofenac sodium (VOLTAREN) 1 % GEL Apply 2 g topically 2 (two) times daily.     empagliflozin (JARDIANCE) 10 MG TABS tablet Take 10 mg by mouth daily.     ibuprofen (ADVIL,MOTRIN) 800 MG tablet Take 800 mg  by mouth every 8 (eight) hours as needed for moderate pain.     losartan-hydrochlorothiazide (HYZAAR) 50-12.5 MG per tablet Take 1 tablet by mouth daily.     metoprolol succinate (TOPROL-XL) 25 MG 24 hr tablet Take 25 mg by mouth daily.     TRADJENTA 5 MG TABS tablet      folic acid (FOLVITE) 546 MCG tablet Take 400 mcg by mouth every Monday, Wednesday, and Friday. (Patient not taking: Reported on 11/01/2020)     LORazepam (ATIVAN) 1 MG tablet Take 2 tablets 30 minutes prior to leaving house on day of procedure. 2 tablet 0   ondansetron (ZOFRAN) 4 MG tablet Take 1 tablet (4 mg total) by mouth every 6 (six) hours as needed for nausea. 12 tablet 0   traMADol (ULTRAM) 50 MG tablet Take 1 tablet (50 mg total) by mouth every 6 (six) hours as needed. 10 tablet 0   No facility-administered medications prior to visit.    PAST MEDICAL HISTORY: Past Medical History:  Diagnosis Date   Anemia yrs ago   Bartholin cyst    Diabetes mellitus without complication (HCC)    fasting 110s type 2   Hypertension    Osteoarthritis    PONV (postoperative nausea and vomiting)    nausea only   Ulcer    Varicose veins    both legs   Vertigo  PAST SURGICAL HISTORY: Past Surgical History:  Procedure Laterality Date   ABDOMINAL HYSTERECTOMY     APPENDECTOMY     BARTHOLIN CYST MARSUPIALIZATION Right 02/28/2018   Procedure: BARTHOLIN CYST MARSUPIALIZATION WITH POSSIBLE BIOPSY;  Surgeon: Isabel Caprice, MD;  Location: [facility];  Service: Gynecology;  Laterality: Right;   CHOLECYSTECTOMY     About 10 years ago   ENDOVENOUS ABLATION SAPHENOUS VEIN W/ LASER  02/29/2012   right small saphenous vein  by Curt Jews MD   ENDOVENOUS ABLATION SAPHENOUS VEIN W/ LASER Left 04/11/2012   left greater saphenous by Curt Jews MD   KNEE ARTHROSCOPY Left    stab phlebectomy  Right 02/05/2020   stab phlebectomy 10-20 incisions right leg by Gae Gallop MD    TOTAL KNEE ARTHROPLASTY Left 08/25/2013    Procedure: LEFT TOTAL KNEE ARTHROPLASTY;  Surgeon: Alta Corning, MD;  Location: [place];  Service: Orthopedics;  Laterality: Left;   VEIN LIGATION AND STRIPPING  1978    FAMILY HISTORY: Family History  Problem Relation Age of Onset   Hypertension Mother    Dementia Mother    Cancer Father    Diabetes Father    Pancreatic cancer Father    Colon cancer Sister    Diabetes Brother    Prostate cancer Brother    Prostate cancer Brother    Diabetes Maternal Grandfather    Heart disease Maternal Grandfather     SOCIAL HISTORY: Social History   Socioeconomic History   Marital status: Widowed    Spouse name: Not on file   Number of children: 3   Years of education: Not on file   Highest education level: Associate degree: occupational, Hotel manager, or vocational program  Occupational History   Not on file  Tobacco Use   Smoking status: Never   Smokeless tobacco: Never  Vaping Use   Vaping Use: Never used  Substance and Sexual Activity   Alcohol use: Yes    Alcohol/week: 1.0 standard drink    Types: 1 Glasses of wine per week    Comment: occassionally   Drug use: No   Sexual activity: Not on file  Other Topics Concern   Not on file  Social History Narrative   11/01/20 grand dgtr with her   Caffeine- 1 coffee, occas soda, tea   Social Determinants of Health   Financial Resource Strain: Not on file  Food Insecurity: Not on file  Transportation Needs: No Transportation Needs   Lack of Transportation (Medical): No   Lack of Transportation (Non-Medical): No  Physical Activity: Not on file  Stress: No Stress Concern Present   Feeling of Stress : Not at all  Social Connections: Not on file  Intimate Partner Violence: Not on file     PHYSICAL EXAM  GENERAL EXAM/CONSTITUTIONAL: Vitals:  Vitals:   11/01/20 1011  BP: 130/68  Pulse: 66  Weight: 226 lb 3.2 oz (102.6 kg)  Height: 5' 6"  (1.676 m)   Body mass index is 36.51 kg/m. Wt Readings from Last 3 Encounters:   11/01/20 226 lb 3.2 oz (102.6 kg)  02/05/20 219 lb (99.3 kg)  01/01/20 220 lb (99.8 kg)   Patient is in no distress; well developed, nourished and groomed; neck is supple  CARDIOVASCULAR: Examination of carotid arteries is normal; no carotid bruits Regular rate and rhythm, no murmurs Examination of peripheral vascular system by observation and palpation is normal  EYES: Ophthalmoscopic exam of optic discs and posterior segments is normal; no papilledema  or hemorrhages No results found.  MUSCULOSKELETAL: Gait, strength, tone, movements noted in Neurologic exam below  NEUROLOGIC: MENTAL STATUS:  No flowsheet data found. awake, alert, oriented to person, place and time recent and remote memory intact normal attention and concentration language fluent, comprehension intact, naming intact fund of knowledge appropriate  CRANIAL NERVE:  2nd - no papilledema on fundoscopic exam 2nd, 3rd, 4th, 6th - pupils equal and reactive to light, visual fields full to confrontation, extraocular muscles intact, no nystagmus 5th - facial sensation symmetric 7th - facial strength symmetric 8th - hearing intact 9th - palate elevates symmetrically, uvula midline 11th - shoulder shrug symmetric 12th - tongue protrusion midline  MOTOR:  normal bulk and tone, full strength in the BUE, BLE  SENSORY:  normal and symmetric to light touch, temperature, vibration  COORDINATION:  finger-nose-finger, fine finger movements normal  REFLEXES:  deep tendon reflexes present and symmetric  GAIT/STATION:  narrow based gait     DIAGNOSTIC DATA (LABS, IMAGING, TESTING) - I reviewed patient records, labs, notes, testing and imaging myself where available.  Lab Results  Component Value Date   WBC 5.9 03/11/2018   HGB 12.2 03/11/2018   HCT 39.6 03/11/2018   MCV 81.1 03/11/2018   PLT 174 03/11/2018      Component Value Date/Time   NA 140 03/11/2018 0112   K 3.4 (L) 03/11/2018 0112   CL 108  03/11/2018 0112   CO2 23 03/11/2018 0112   GLUCOSE 164 (H) 03/11/2018 0112   BUN 27 (H) 03/11/2018 0112   CREATININE 1.04 (H) 03/11/2018 0112   CALCIUM 8.9 03/11/2018 0112   PROT 7.4 02/13/2017 0527   ALBUMIN 3.8 02/13/2017 0527   AST 18 02/13/2017 0527   ALT 17 02/13/2017 0527   ALKPHOS 86 02/13/2017 0527   BILITOT 0.3 02/13/2017 0527   GFRNONAA 52 (L) 03/11/2018 0112   GFRAA >60 03/11/2018 0112   No results found for: CHOL, HDL, LDLCALC, LDLDIRECT, TRIG, CHOLHDL No results found for: HGBA1C No results found for: VITAMINB12 No results found for: TSH   02/26/19 MRA head  - Normal MRA of the head.    ASSESSMENT AND PLAN  80 y.o. year old female here with abnormal sensation left side of head with some migraine features, in the setting of increased stress over the past 3 to 6 months.  We will proceed with further work-up to rule out other secondary causes.   Dx:  1. Left-sided headache   2. Left eye pain   3. Vertigo   4. Nausea     PLAN:  LEFT SIDED HEAD SENSATIONS / LEFT EYE PAIN / NAUSEA / VERTIGO - check MRI brain, ESR, CRP - if negative, then could represent migraine phenomenon in setting of increased stress; consider migraine treatments in future if symptoms do not improve (topiramate)  Orders Placed This Encounter  Procedures   MR BRAIN W WO CONTRAST   Sedimentation Rate   C-reactive Protein   CBC with Differential/Platelet   Comprehensive metabolic panel   Return for pending if symptoms worsen or fail to improve, pending test results.    Penni Bombard, MD 47/07/5463, 03:54 AM Certified in Neurology, Neurophysiology and Neuroimaging  [facility] [address], [place], [place] [address] [phone]

## 2020-11-02 ENCOUNTER — Other Ambulatory Visit: Payer: Self-pay

## 2020-11-02 LAB — COMPREHENSIVE METABOLIC PANEL
ALT: 15 IU/L (ref 0–32)
AST: 16 IU/L (ref 0–40)
Albumin/Globulin Ratio: 1.6 (ref 1.2–2.2)
Albumin: 4.7 g/dL (ref 3.7–4.7)
Alkaline Phosphatase: 91 IU/L (ref 44–121)
BUN/Creatinine Ratio: 16 (ref 12–28)
BUN: 15 mg/dL (ref 8–27)
Bilirubin Total: 0.2 mg/dL (ref 0.0–1.2)
CO2: 24 mmol/L (ref 20–29)
Calcium: 9.7 mg/dL (ref 8.7–10.3)
Chloride: 102 mmol/L (ref 96–106)
Creatinine, Ser: 0.93 mg/dL (ref 0.57–1.00)
Globulin, Total: 3 g/dL (ref 1.5–4.5)
Glucose: 147 mg/dL — ABNORMAL HIGH (ref 70–99)
Potassium: 3.7 mmol/L (ref 3.5–5.2)
Sodium: 141 mmol/L (ref 134–144)
Total Protein: 7.7 g/dL (ref 6.0–8.5)
eGFR: 62 mL/min/{1.73_m2} (ref >59)

## 2020-11-02 LAB — SEDIMENTATION RATE: Sed Rate: 28 mm/hr (ref 0–40)

## 2020-11-02 LAB — C-REACTIVE PROTEIN: CRP: 7 mg/L (ref 0–10)

## 2020-11-02 NOTE — Patient Outreach (Signed)
[facility]) Care Management  [facility] Care Manager  11/02/2020   Erica Buckley [birth year] [id number]  Subjective: Telephone call to patient for disease management support.  She reports having some stress around taking care of her mom.  Discussed caregiver stress and caring for self.  Blood sugar up some with the highest 178.  Discussed diabetes management.    Objective:   Encounter Medications:  Outpatient Encounter Medications as of 11/02/2020  Medication Sig Note   B Complex-C (B-COMPLEX WITH VITAMIN C) tablet Take 1 tablet by mouth every other day.     diclofenac sodium (VOLTAREN) 1 % GEL Apply 2 g topically 2 (two) times daily.    empagliflozin (JARDIANCE) 10 MG TABS tablet Take 10 mg by mouth daily.    folic acid (FOLVITE) 400 MCG tablet Take 400 mcg by mouth every Monday, Wednesday, and Friday. (Patient not taking: Reported on 11/01/2020)    ibuprofen (ADVIL,MOTRIN) 800 MG tablet Take 800 mg by mouth every 8 (eight) hours as needed for moderate pain.    losartan-hydrochlorothiazide (HYZAAR) 50-12.5 MG per tablet Take 1 tablet by mouth daily.    metoprolol succinate (TOPROL-XL) 25 MG 24 hr tablet Take 25 mg by mouth daily.    TRADJENTA 5 MG TABS tablet  02/03/2020: One tablet per day   No facility-administered encounter medications on file as of 11/02/2020.    Functional Status:  In your present state of health, do you have any difficulty performing the following activities: 11/04/2019  Hearing? N  Vision? N  Difficulty concentrating or making decisions? N  Walking or climbing stairs? N  Dressing or bathing? N  Doing errands, shopping? N  Preparing Food and eating ? N  Using the Toilet? N  In the past six months, have you accidently leaked urine? N  Do you have problems with loss of bowel control? N  Managing your Medications? N  Managing your Finances? N  Housekeeping or managing your Housekeeping? N  Some recent data might be hidden     Fall/Depression Screening: Fall Risk  02/03/2020 11/04/2019 02/19/2019  Falls in the past year? 0 0 0  Number falls in past yr: 0 - -  Injury with Fall? 0 - -   PHQ 2/9 Scores 02/03/2020 11/04/2019 11/11/2018 10/30/2018  PHQ - 2 Score 0 0 0 0    Assessment:   Care Plan Care Plan : Diabetes Type 2 (Adult)  Updates made by Fleeta Emmer, RN since 11/02/2020 12:00 AM     Problem: Disease Progression (Diabetes, Type 2)      Long-Range Goal: Disease Progression Prevented or Minimized as evidenced by blood sugar less than 150.   Start Date: 11/04/2019  Expected End Date: 07/29/2021  This Visit's Progress: On track  Recent Progress: On track  Priority: High  Note:   Evidence-based guidance:  Anticipate A1C testing (point-of-care) every 3 to 6 months based on goal  attainment. Promote self-monitoring of blood glucose levels  Notes:    Task: Monitor and Manage Follow-up for Comorbidities   Due Date: 07/29/2021  Priority: Routine  Responsible User: Fleeta Emmer, RN  Note:   Care Management Activities:    - healthy lifestyle promoted - response to pharmacologic therapy monitored    Notes: 05/03/20 Patient Continues daily sugar monitoring.  Blood sugar less than 100 per patient.    08/03/20 Patient continues sugar monitoring.  Blood sugar 100-120.  Diabetes Management Discussed: Medication adherence Reviewed importance of limiting carbs such as rice, potatoes, breads, and  pastas. Also discussed limiting sweets and sugary drinks.  Discussed importance of portion control.  Also discussed importance of annual exams, foot exams, and eye exams.   11/02/20 Patient blood sugars up some at the highest 178.  Discussed diet and routine playing a part in diabetes management.  Reiterated diabetes management.        Goals Addressed             This Visit's Progress    Monitor and Manage My Blood Sugar   On track    Barriers: Health Behaviors Timeframe:  Long-Range Goal Priority:   High Start Date:  02/03/20                           Expected End Date:     07/29/21  Follow Up Date  03/01/20  - check blood sugar at prescribed times - check blood sugar if I feel it is too high or too low - take the blood sugar meter to all doctor visits    Why is this important?   Checking your blood sugar at home helps to keep it from getting very high or very low.  Writing the results in a diary or log helps the doctor know how to care for you.  Your blood sugar log should have the time, date and the results.  Also, write down the amount of insulin or other medicine that you take.  Other information, like what you ate, exercise done and how you were feeling, will also be helpful.     Notes: Take medications as prescribed and continue to monitor sugars.   05/04/20 Keep up the great work!! 08/03/20 Keep monitoring sugars.   Diabetes Management Discussed: Medication adherence Reviewed importance of limiting carbs such as rice, potatoes, breads, and pastas. Also discussed limiting sweets and sugary drinks.  Discussed importance of portion control.  Also discussed importance of annual exams, foot exams, and eye exams.   11/02/20 Highest blood sugar 178.  Reiterated diabetes management.  No concerns.           Plan:  Follow-up: Patient agrees to Care Plan and Follow-up. Follow-up in 3 month(s)  Bary Leriche, RN, MSN [facility] Care Management Care Management Coordinator Direct Line [phone] Cell [phone] Toll Free: [phone]  Fax: [phone]

## 2020-11-02 NOTE — Patient Instructions (Signed)
Goals Addressed             This Visit's Progress    Monitor and Manage My Blood Sugar   On track    Barriers: Health Behaviors Timeframe:  Long-Range Goal Priority:  High Start Date:  02/03/20                           Expected End Date:     07/29/21  Follow Up Date  03/01/20  - check blood sugar at prescribed times - check blood sugar if I feel it is too high or too low - take the blood sugar meter to all doctor visits    Why is this important?   Checking your blood sugar at home helps to keep it from getting very high or very low.  Writing the results in a diary or log helps the doctor know how to care for you.  Your blood sugar log should have the time, date and the results.  Also, write down the amount of insulin or other medicine that you take.  Other information, like what you ate, exercise done and how you were feeling, will also be helpful.     Notes: Take medications as prescribed and continue to monitor sugars.   05/04/20 Keep up the great work!! 08/03/20 Keep monitoring sugars.   Diabetes Management Discussed: Medication adherence Reviewed importance of limiting carbs such as rice, potatoes, breads, and pastas. Also discussed limiting sweets and sugary drinks.  Discussed importance of portion control.  Also discussed importance of annual exams, foot exams, and eye exams.   11/02/20 Highest blood sugar 178.  Reiterated diabetes management.  No concerns.

## 2020-11-16 DIAGNOSIS — K219 Gastro-esophageal reflux disease without esophagitis: Secondary | ICD-10-CM | POA: Diagnosis not present

## 2020-11-16 DIAGNOSIS — M1712 Unilateral primary osteoarthritis, left knee: Secondary | ICD-10-CM | POA: Diagnosis not present

## 2020-11-16 DIAGNOSIS — E1169 Type 2 diabetes mellitus with other specified complication: Secondary | ICD-10-CM | POA: Diagnosis not present

## 2020-11-16 DIAGNOSIS — I10 Essential (primary) hypertension: Secondary | ICD-10-CM | POA: Diagnosis not present

## 2020-11-22 ENCOUNTER — Ambulatory Visit
Admission: RE | Admit: 2020-11-22 | Discharge: 2020-11-22 | Disposition: A | Discharge status: Home / Self Care | Payer: Medicare HMO | Source: Ambulatory Visit | Attending: Diagnostic Neuroimaging | Admitting: Diagnostic Neuroimaging

## 2020-11-22 ENCOUNTER — Other Ambulatory Visit: Payer: Self-pay

## 2020-11-22 DIAGNOSIS — R42 Dizziness and giddiness: Secondary | ICD-10-CM

## 2020-11-22 DIAGNOSIS — H5712 Ocular pain, left eye: Secondary | ICD-10-CM

## 2020-11-22 DIAGNOSIS — R110 Nausea: Secondary | ICD-10-CM

## 2020-11-22 DIAGNOSIS — R519 Headache, unspecified: Secondary | ICD-10-CM

## 2020-11-22 MED ORDER — GADOBENATE DIMEGLUMINE 529 MG/ML IV SOLN
20.0000 mL | Freq: Once | INTRAVENOUS | Status: AC | PRN
  Administered 2020-11-22: 20 mL via INTRAVENOUS

## 2020-11-25 ENCOUNTER — Telehealth: Payer: Self-pay | Admitting: *Deleted

## 2020-11-25 NOTE — Telephone Encounter (Signed)
Spoke with patient and informed her the MRI brain result was unremarkable imaging results.  Continue current plan of monitoring her symptoms, call if needed. Informed her that her labs are unremarkable as well. Patient verbalized understanding, appreciation.

## 2020-11-26 DIAGNOSIS — E1169 Type 2 diabetes mellitus with other specified complication: Secondary | ICD-10-CM | POA: Diagnosis not present

## 2020-12-28 ENCOUNTER — Telehealth: Payer: Self-pay

## 2020-12-28 DIAGNOSIS — E1169 Type 2 diabetes mellitus with other specified complication: Secondary | ICD-10-CM | POA: Diagnosis not present

## 2020-12-28 NOTE — Telephone Encounter (Signed)
Pt called with c/o pain in RLE that comes and goes for about 1 month. She denies any swelling, redness or warmth of the area. It is a "numbness" type of pain she stated. Pt has been scheduled for MD f/u with reflux study.

## 2021-01-19 DIAGNOSIS — E1169 Type 2 diabetes mellitus with other specified complication: Secondary | ICD-10-CM | POA: Diagnosis not present

## 2021-01-19 DIAGNOSIS — K219 Gastro-esophageal reflux disease without esophagitis: Secondary | ICD-10-CM | POA: Diagnosis not present

## 2021-01-19 DIAGNOSIS — I10 Essential (primary) hypertension: Secondary | ICD-10-CM | POA: Diagnosis not present

## 2021-01-19 DIAGNOSIS — M1712 Unilateral primary osteoarthritis, left knee: Secondary | ICD-10-CM | POA: Diagnosis not present

## 2021-01-27 ENCOUNTER — Other Ambulatory Visit: Payer: Self-pay | Admitting: *Deleted

## 2021-01-27 DIAGNOSIS — I83811 Varicose veins of right lower extremities with pain: Secondary | ICD-10-CM

## 2021-01-27 NOTE — Patient Outreach (Signed)
[facility]) Care Management  01/27/2021  Erica Buckley September 22, 1940 [id number]   Called pt inform patient appt cancelled and will be rescheduled in February.  Thank you, Vanice Sarah [facility] Care Management Assistant

## 2021-02-01 ENCOUNTER — Ambulatory Visit: Payer: Self-pay

## 2021-02-02 ENCOUNTER — Other Ambulatory Visit: Payer: Medicare HMO

## 2021-02-02 DIAGNOSIS — B379 Candidiasis, unspecified: Secondary | ICD-10-CM | POA: Diagnosis not present

## 2021-02-02 DIAGNOSIS — L0231 Cutaneous abscess of buttock: Secondary | ICD-10-CM | POA: Diagnosis not present

## 2021-02-09 ENCOUNTER — Ambulatory Visit: Payer: Medicare HMO | Admitting: Vascular Surgery

## 2021-02-09 ENCOUNTER — Encounter (HOSPITAL_COMMUNITY): Payer: Medicare HMO

## 2021-02-28 DIAGNOSIS — E1169 Type 2 diabetes mellitus with other specified complication: Secondary | ICD-10-CM | POA: Diagnosis not present

## 2021-03-28 ENCOUNTER — Other Ambulatory Visit: Payer: Self-pay

## 2021-03-28 DIAGNOSIS — E1169 Type 2 diabetes mellitus with other specified complication: Secondary | ICD-10-CM | POA: Diagnosis not present

## 2021-03-28 NOTE — Addendum Note (Signed)
Addended by: Bary Leriche on: 03/28/2021 04:01 PM   Modules accepted: Orders

## 2021-03-28 NOTE — Patient Instructions (Signed)
Patient Goals/Self-Care Activities: Diabetes Take all medications as prescribed check blood sugar at prescribed times: once daily check feet daily for cuts, sores or redness enter blood sugar readings and medication or insulin into daily log take the blood sugar log to all doctor visits

## 2021-03-28 NOTE — Patient Outreach (Signed)
[facility]) Care Management Telephonic RN Care Manager Note   03/28/2021 Name:  Erica Buckley MRN:  [id number] DOB:  [birth year]  Summary: Telephone call to patient for disease management. Patient reports she is doing good.  Her blood sugars are controlled.  With last reading 120.  Discussed diabetes management.  She is at her mom's home in [facility] taking care of her.  Discussed caregiver role and importance of caring for herself.  She verbalized understanding. Patient now has [employer].  Advised her that CM would be sending her to health coach for further disease management.  She verbalized understanding. Patient request new calender. CM will send out in the mail.    Recommendations/Changes made from today's visit: Continue to check sugars at least daily and limit carbohydrates and sweets.    Subjective: Erica Buckley is an 81 y.o. year old female who is a primary patient of Laurann Montana, MD. The care management team was consulted for assistance with care management and/or care coordination needs.    Telephonic RN Care Manager completed Telephone Visit today.  Objective:   Medications Reviewed Today     Reviewed by Fleeta Emmer, RN (Case Manager) on 03/28/21 at 1521  Med List Status: <None>   Medication Order Taking? Sig Documenting Provider Last Dose Status Informant  B Complex-C (B-COMPLEX WITH VITAMIN C) tablet [id number] Yes Take 1 tablet by mouth every other day.  [provider] Taking Active Self  diclofenac sodium (VOLTAREN) 1 % GEL [id number] Yes Apply 2 g topically 2 (two) times daily. [provider] Taking Active Self  empagliflozin (JARDIANCE) 10 MG TABS tablet [id number] Yes Take 10 mg by mouth daily. [provider] Taking Active Self  folic acid (FOLVITE) 400 MCG tablet [id number] No Take 400 mcg by mouth every Monday, Wednesday, and Friday.  Patient not taking: Reported on 11/01/2020    [provider] Not Taking Active Self  ibuprofen (ADVIL,MOTRIN) 800 MG tablet [id number] Yes Take 800 mg by mouth every 8 (eight) hours as needed for moderate pain. [provider] Taking Active Self  losartan-hydrochlorothiazide (HYZAAR) 50-12.5 MG per tablet [id number] Yes Take 1 tablet by mouth daily. [provider] Taking Active Self  metoprolol succinate (TOPROL-XL) 25 MG 24 hr tablet [id number] Yes Take 25 mg by mouth daily. [provider] Taking Active Self  TRADJENTA 5 MG TABS tablet [id number] Yes  [provider] Taking Active            Med Note Karilyn Cota, Rance Muir   Tue Feb 03, 2020  9:33 AM) One tablet per day             SDOH:  (Social Determinants of Health) assessments and interventions performed:     Care Plan  Review of patient past medical history, allergies, medications, health status, including review of consultants reports, laboratory and other test data, was performed as part of comprehensive evaluation for care management services.   Care Plan : Diabetes Type 2 (Adult)  Updates made by Fleeta Emmer, RN since 03/28/2021 12:00 AM     Problem: Disease Progression (Diabetes, Type 2)      Long-Range Goal: Disease Progression Prevented or Minimized as evidenced by blood sugar less than 150. Completed 03/28/2021  Start Date: 11/04/2019  Expected End Date: 07/29/2021  This Visit's Progress: On track  Recent Progress: On track  Priority: High  Note:   Evidence-based guidance:  Anticipate A1C testing (point-of-care) every 3 to 6 months  based on goal  attainment. Promote self-monitoring of blood glucose levels  Notes: 03/28/21 Duplicate goal    Task: Monitor and Manage Follow-up for Comorbidities Completed 03/28/2021  Due Date: 07/29/2021  Priority: Routine  Responsible User: Fleeta Emmer, RN  Note:   Care Management Activities:    - healthy lifestyle promoted - response to pharmacologic therapy monitored     Notes: 05/03/20 Patient Continues daily sugar monitoring.  Blood sugar less than 100 per patient.    08/03/20 Patient continues sugar monitoring.  Blood sugar 100-120.  Diabetes Management Discussed: Medication adherence Reviewed importance of limiting carbs such as rice, potatoes, breads, and pastas. Also discussed limiting sweets and sugary drinks.  Discussed importance of portion control.  Also discussed importance of annual exams, foot exams, and eye exams.   11/02/20 Patient blood sugars up some at the highest 178.  Discussed diet and routine playing a part in diabetes management.  Reiterated diabetes management.      Care Plan : RN Care Manager Plan of Care  Updates made by Fleeta Emmer, RN since 03/28/2021 12:00 AM     Problem: Chronic Management and Care Coordination Needs of Diabetes   Priority: High     Long-Range Goal: Development of plan of care for management of diabetes   Start Date: 03/28/2021  Expected End Date: 01/29/2022  This Visit's Progress: On track  Priority: High  Note:   Current Barriers:  Chronic Disease Management support and education needs related to DMII   RNCM Clinical Goal(s):  Patient will verbalize understanding of plan for management of DMII as evidenced by A1c less than 7.5 demonstrate Ongoing adherence to prescribed treatment plan for DMII as evidenced by blood sugar report less than 150 continue to work with RN Care Manager to address care management and care coordination needs related to  DMII as evidenced by adherence to CM Team Scheduled appointments through collaboration with RN Care manager, provider, and care team.   Interventions: Education and support related to diabetes Inter-disciplinary care team collaboration (see longitudinal plan of care) Evaluation of current treatment plan related to  self management and patient's adherence to plan as established by provider   Diabetes Interventions:  (Status:  Goal on track:  Yes.) Long Term  Goal Assessed patient's understanding of A1c goal:  less than 7.5 Provided education to patient about basic DM disease process Reviewed medications with patient and discussed importance of medication adherence Counseled on importance of regular laboratory monitoring as prescribed Discussed plans with patient for ongoing care management follow up and provided patient with direct contact information for care management team No results found for: HGBA1C  Patient Goals/Self-Care Activities: Diabetes Take all medications as prescribed check blood sugar at prescribed times: once daily check feet daily for cuts, sores or redness enter blood sugar readings and medication or insulin into daily log take the blood sugar log to all doctor visits  Follow Up Plan:  Telephone follow up appointment with care management team member scheduled for:  May The patient has been provided with contact information for the care management team and has been advised to call with any health related questions or concerns.        Plan:  Telephone follow up appointment with care management team member scheduled for:  May The patient has been provided with contact information for the care management team and has been advised to call with any health related questions or concerns.  Follow-up: Patient agrees to Care Plan and Follow-up.

## 2021-03-29 ENCOUNTER — Ambulatory Visit: Payer: Self-pay

## 2021-03-29 NOTE — Patient Outreach (Signed)
[facility]) Care Management  03/29/2021  Erica Buckley 1940-10-15 [id number]   Referral received from Jon Billings, RN for health coach Care Management services to assess for disease management for diabetes. Assigned to Johny Shock, RN Care Coordinator.    Ina Homes [facility] Management Assistant [phone]

## 2021-03-30 ENCOUNTER — Other Ambulatory Visit: Payer: Self-pay | Admitting: *Deleted

## 2021-04-06 DIAGNOSIS — I10 Essential (primary) hypertension: Secondary | ICD-10-CM | POA: Diagnosis not present

## 2021-04-06 DIAGNOSIS — E1169 Type 2 diabetes mellitus with other specified complication: Secondary | ICD-10-CM | POA: Diagnosis not present

## 2021-04-11 DIAGNOSIS — H2513 Age-related nuclear cataract, bilateral: Secondary | ICD-10-CM | POA: Diagnosis not present

## 2021-04-20 DIAGNOSIS — R051 Acute cough: Secondary | ICD-10-CM | POA: Diagnosis not present

## 2021-04-20 DIAGNOSIS — U071 COVID-19: Secondary | ICD-10-CM | POA: Diagnosis not present

## 2021-04-20 DIAGNOSIS — J029 Acute pharyngitis, unspecified: Secondary | ICD-10-CM | POA: Diagnosis not present

## 2021-04-20 DIAGNOSIS — R059 Cough, unspecified: Secondary | ICD-10-CM | POA: Diagnosis not present

## 2021-04-20 DIAGNOSIS — R52 Pain, unspecified: Secondary | ICD-10-CM | POA: Diagnosis not present

## 2021-05-25 DIAGNOSIS — E1169 Type 2 diabetes mellitus with other specified complication: Secondary | ICD-10-CM | POA: Diagnosis not present

## 2021-06-06 ENCOUNTER — Other Ambulatory Visit: Payer: Self-pay | Admitting: *Deleted

## 2021-06-08 NOTE — Patient Outreach (Signed)
[facility]) Care Management ?RN Health Coach Note ? ? ?06/08/2021 ?Name:  Erica Buckley MRN:  [id number] DOB:  January 26, 1941 ? ?Summary: ?Her fasting blood sugar is 122. Patient A1C is 7.5. Her next A1C will be drawn 06/09/2021.  Her appetite is good. She eats fresh fruit for breakfast. Patient uses an Iglucose to monitor blood sugar. Patient has been taking care of her mother in [place] and comes home every 3 months.  She has not been able to exercise. She has not had any recent falls. She is having some knee pain and uses Voltaren.  ? ?Recommendations/Changes made from today's visit: ?Start an exercise routine ?Monitor blood sugars  ?Medication adherence ?Remember to take care of the caregiver ? ? ?Subjective: ?Erica Buckley is an 81 y.o. year old female who is a primary patient of Laurann Montana, MD. The care management team was consulted for assistance with care management and/or care coordination needs.   ? ?RN Health Coach completed Telephone Visit today.  ? ?Objective: ? ?Medications Reviewed Today   ? ? Reviewed by Fleeta Emmer, RN (Case Manager) on 03/28/21 at 1521  Med List Status: <None>  ? ?Medication Order Taking? Sig Documenting Provider Last Dose Status Informant  ?B Complex-C (B-COMPLEX WITH VITAMIN C) tablet [id number] Yes Take 1 tablet by mouth every other day.  [provider] Taking Active Self  ?diclofenac sodium (VOLTAREN) 1 % GEL [id number] Yes Apply 2 g topically 2 (two) times daily. [provider] Taking Active Self  ?empagliflozin (JARDIANCE) 10 MG TABS tablet [id number] Yes Take 10 mg by mouth daily. [provider] Taking Active Self  ?folic acid (FOLVITE) 400 MCG tablet [id number] No Take 400 mcg by mouth every Monday, Wednesday, and Friday.  ?Patient not taking: Reported on 11/01/2020  ? [provider] Not Taking Active Self  ?ibuprofen (ADVIL,MOTRIN) 800 MG tablet [id number] Yes Take 800 mg by mouth every 8  (eight) hours as needed for moderate pain. [provider] Taking Active Self  ?losartan-hydrochlorothiazide (HYZAAR) 50-12.5 MG per tablet [id number] Yes Take 1 tablet by mouth daily. [provider] Taking Active Self  ?metoprolol succinate (TOPROL-XL) 25 MG 24 hr tablet [id number] Yes Take 25 mg by mouth daily. [provider] Taking Active Self  ?TRADJENTA 5 MG TABS tablet [id number] Yes  [provider] Taking Active   ?         ?Med Note Karilyn Cota, DIONNE J   Tue Feb 03, 2020  9:33 AM) One tablet per day  ? ?  ?  ? ?  ? ? ? ?SDOH:  (Social Determinants of Health) assessments and interventions performed:  ?SDOH Interventions   ? ?Flowsheet Row Most Recent Value  ?SDOH Interventions   ?Transportation Interventions Intervention Not Indicated  ? ?  ? ? ?Care Plan ? ?Review of patient past medical history, allergies, medications, health status, including review of consultants reports, laboratory and other test data, was performed as part of comprehensive evaluation for care management services.  ? ?Care Plan : Diabetes Type 2 (Adult)  ?Updates made by Wilda Wetherell, Dennard Schaumann, RN since 06/08/2021 12:00 AM  ?  ? ?Problem: Disease Progression (Diabetes, Type 2) Resolved 06/06/2021  ?Note:   ?[id number] Resolving due to duplicate goal ? ? ?  ? ?Care Plan : RN Care Manager Plan of Care  ?Updates made by Faust Thorington, Dennard Schaumann, RN since 06/08/2021 12:00 AM  ?  ? ?Problem: Chronic Management and Care Coordination Needs of Diabetes   ?  Priority: High  ?  ? ?Long-Range Goal: Development of plan of care for management of diabetes   ?Start Date: 03/28/2021  ?Expected End Date: 01/29/2022  ?Recent Progress: On track  ?Priority: High  ?Note:   ?Current Barriers:  ?Chronic Disease Management support and education needs related to DMII  ? ?RNCM Clinical Goal(s):  ?Patient will verbalize understanding of plan for management of DMII as evidenced by A1c less than 7.5 ?demonstrate Ongoing adherence to prescribed  treatment plan for DMII as evidenced by blood sugar report less than 150 ?continue to work with RN Care Manager to address care management and care coordination needs related to  DMII as evidenced by adherence to CM Team Scheduled appointments through collaboration with RN Care manager, provider, and care team.  ? ?Interventions: ?Education and support related to diabetes ?Inter-disciplinary care team collaboration (see longitudinal plan of care) ?Evaluation of current treatment plan related to  self management and patient's adherence to plan as established by provider ? ? ?Diabetes Interventions:  (Status:  Goal on track:  Yes.) Long Term Goal ?Assessed patient's understanding of A1c goal:  less than 7.5 ?Provided education to patient about basic DM disease process ?Reviewed medications with patient and discussed importance of medication adherence ?Counseled on importance of regular laboratory monitoring as prescribed ?Discussed plans with patient for ongoing care management follow up and provided patient with direct contact information for care management team ?No results found for: HGBA1C ? ?HGB A1C 7.5 ? ?Patient Goals/Self-Care Activities: Diabetes ?Take all medications as prescribed ?check blood sugar at prescribed times: once daily ?check feet daily for cuts, sores or redness ?enter blood sugar readings and medication or insulin into daily log ?take the blood sugar log to all doctor visits ?Start an exercise routine ? ?Follow Up Plan:  Telephone follow up appointment with care management team member scheduled for:  September 06, 2021 ?The patient has been provided with contact information for the care management team and has been advised to call with any health related questions or concerns.  ? ?[id number] Fasting blood sugar is 122. A1C is 7.5.patient is having the next A1C drawn on 06/09/2021. Patient has not had any recent falls. She has been taking care of her  5 year old mother in [place] and have not  been able to exercise. Per patient she is taking her medications as per ordered.She is eating fresh fruit cups for breakfast. She is having knee pain from a past knee replacement.She is using Voltaren daily. ? ?  ?  ? ?Plan: Telephone follow up appointment with care management team member scheduled for:  September 06, 2021 ?The patient has been provided with contact information for the care management team and has been advised to call with any health related questions or concerns.  ? ?Gean Maidens BSN RN ?[facility] Care Management ?[phone] ? ? ? ?  ?

## 2021-06-08 NOTE — Patient Instructions (Signed)
Visit Information ? ?Thank you for taking time to visit with me today. Please don't hesitate to contact me if I can be of assistance to you before our next scheduled telephone appointment. ? ?Following are the goals we discussed today:  ?Current Barriers:  ?Chronic Disease Management support and education needs related to DMII  ? ?RNCM Clinical Goal(s):  ?Patient will verbalize understanding of plan for management of DMII as evidenced by A1c less than 7.5 ?demonstrate Ongoing adherence to prescribed treatment plan for DMII as evidenced by blood sugar report less than 150 ?continue to work with RN Care Manager to address care management and care coordination needs related to  DMII as evidenced by adherence to CM Team Scheduled appointments through collaboration with RN Care manager, provider, and care team.  ? ?Interventions: ?Education and support related to diabetes ?Inter-disciplinary care team collaboration (see longitudinal plan of care) ?Evaluation of current treatment plan related to  self management and patient's adherence to plan as established by provider ? ? ?Diabetes Interventions:  (Status:  Goal on track:  Yes.) Long Term Goal ?Assessed patient's understanding of A1c goal: less than 7.5 ?Provided education to patient about basic DM disease process ?Reviewed medications with patient and discussed importance of medication adherence ?Counseled on importance of regular laboratory monitoring as prescribed ?Discussed plans with patient for ongoing care management follow up and provided patient with direct contact information for care management team ?No results found for: HGBA1C ? ?HGB A1C 7.5 ? ?Patient Goals/Self-Care Activities: Diabetes ?Take all medications as prescribed ?check blood sugar at prescribed times: once daily ?check feet daily for cuts, sores or redness ?enter blood sugar readings and medication or insulin into daily log ?take the blood sugar log to all doctor visits ?Start an exercise  routine ? ?Follow Up Plan:  Telephone follow up appointment with care management team member scheduled for:  September 06, 2021 ?The patient has been provided with contact information for the care management team and has been advised to call with any health related questions or concerns.  ? ?[id number] Fasting blood sugar is 122. A1C is 7.5.  The next A1C will be drawn on 06/09/2021. Patient has not had any recent falls. She has been taking care of her  81 year old mother in [place] . She has been able to exercise. Per patient she is taking her medications as per ordered. She is eating fresh fruit cups for breakfast.  She is having knee pain from a past knee replacement. She is using Voltaren daily. ? ? ?Our next appointment is by telephone on September 06, 2021 ? ?Please call Gean Maidens RN [phone] if you need to cancel or reschedule your appointment.  ? ?Please call the Suicide and Crisis Lifeline: 988 if you are experiencing a Mental Health or Behavioral Health Crisis or need someone to talk to. ? ?The patient verbalized understanding of instructions, educational materials, and care plan provided today and agreed to receive a mailed copy of patient instructions, educational materials, and care plan.  ? ?Telephone follow up appointment with care management team member scheduled for: ?The patient has been provided with contact information for the care management team and has been advised to call with any health related questions or concerns.  ? ?SIGNATURE ? ?Gean Maidens BSN RN ?[facility] Care Management ?[phone] ? ? ? ? ?

## 2021-06-10 DIAGNOSIS — E1169 Type 2 diabetes mellitus with other specified complication: Secondary | ICD-10-CM | POA: Diagnosis not present

## 2021-06-29 DIAGNOSIS — E1169 Type 2 diabetes mellitus with other specified complication: Secondary | ICD-10-CM | POA: Diagnosis not present

## 2021-08-11 DIAGNOSIS — H3589 Other specified retinal disorders: Secondary | ICD-10-CM | POA: Diagnosis not present

## 2021-08-11 DIAGNOSIS — H2511 Age-related nuclear cataract, right eye: Secondary | ICD-10-CM | POA: Diagnosis not present

## 2021-08-11 DIAGNOSIS — H2512 Age-related nuclear cataract, left eye: Secondary | ICD-10-CM | POA: Diagnosis not present

## 2021-08-11 DIAGNOSIS — H40013 Open angle with borderline findings, low risk, bilateral: Secondary | ICD-10-CM | POA: Diagnosis not present

## 2021-08-11 DIAGNOSIS — E113299 Type 2 diabetes mellitus with mild nonproliferative diabetic retinopathy without macular edema, unspecified eye: Secondary | ICD-10-CM | POA: Diagnosis not present

## 2021-08-11 DIAGNOSIS — H35033 Hypertensive retinopathy, bilateral: Secondary | ICD-10-CM | POA: Diagnosis not present

## 2021-08-19 ENCOUNTER — Other Ambulatory Visit: Payer: Self-pay | Admitting: *Deleted

## 2021-08-19 NOTE — Patient Outreach (Signed)
[facility]) Care Management  08/19/2021  Erica Buckley [birth year] [id number]  RN Health Coach telephone call to patient.  [employer] compliance verified. Patient A1C is 7.2 decreased from 7.5.  She is walking some. She is still currently in [place] taking care of her mother.  RN Case closure. Patient has met her goals of care.  [facility] Care Management [phone]

## 2021-08-29 DIAGNOSIS — E1169 Type 2 diabetes mellitus with other specified complication: Secondary | ICD-10-CM | POA: Diagnosis not present

## 2021-08-30 DIAGNOSIS — H2511 Age-related nuclear cataract, right eye: Secondary | ICD-10-CM | POA: Diagnosis not present

## 2021-08-30 DIAGNOSIS — H3589 Other specified retinal disorders: Secondary | ICD-10-CM | POA: Diagnosis not present

## 2021-09-06 ENCOUNTER — Ambulatory Visit: Payer: Self-pay | Admitting: *Deleted

## 2021-10-05 DIAGNOSIS — Z01812 Encounter for preprocedural laboratory examination: Secondary | ICD-10-CM | POA: Diagnosis not present

## 2021-10-05 DIAGNOSIS — I10 Essential (primary) hypertension: Secondary | ICD-10-CM | POA: Diagnosis not present

## 2021-10-05 DIAGNOSIS — Z01810 Encounter for preprocedural cardiovascular examination: Secondary | ICD-10-CM | POA: Diagnosis not present

## 2021-10-05 DIAGNOSIS — Z79899 Other long term (current) drug therapy: Secondary | ICD-10-CM | POA: Diagnosis not present

## 2021-10-05 DIAGNOSIS — E1136 Type 2 diabetes mellitus with diabetic cataract: Secondary | ICD-10-CM | POA: Diagnosis not present

## 2021-10-05 DIAGNOSIS — H2511 Age-related nuclear cataract, right eye: Secondary | ICD-10-CM | POA: Diagnosis not present

## 2021-10-05 DIAGNOSIS — E7800 Pure hypercholesterolemia, unspecified: Secondary | ICD-10-CM | POA: Diagnosis not present

## 2021-10-05 DIAGNOSIS — Z7984 Long term (current) use of oral hypoglycemic drugs: Secondary | ICD-10-CM | POA: Diagnosis not present

## 2021-10-10 DIAGNOSIS — Z7984 Long term (current) use of oral hypoglycemic drugs: Secondary | ICD-10-CM | POA: Diagnosis not present

## 2021-10-10 DIAGNOSIS — Z79899 Other long term (current) drug therapy: Secondary | ICD-10-CM | POA: Diagnosis not present

## 2021-10-10 DIAGNOSIS — E7800 Pure hypercholesterolemia, unspecified: Secondary | ICD-10-CM | POA: Diagnosis not present

## 2021-10-10 DIAGNOSIS — H2511 Age-related nuclear cataract, right eye: Secondary | ICD-10-CM | POA: Diagnosis not present

## 2021-10-10 DIAGNOSIS — Z01812 Encounter for preprocedural laboratory examination: Secondary | ICD-10-CM | POA: Diagnosis not present

## 2021-10-10 DIAGNOSIS — Z01810 Encounter for preprocedural cardiovascular examination: Secondary | ICD-10-CM | POA: Diagnosis not present

## 2021-10-10 DIAGNOSIS — E1136 Type 2 diabetes mellitus with diabetic cataract: Secondary | ICD-10-CM | POA: Diagnosis not present

## 2021-10-10 DIAGNOSIS — I10 Essential (primary) hypertension: Secondary | ICD-10-CM | POA: Diagnosis not present

## 2021-10-20 DIAGNOSIS — H2512 Age-related nuclear cataract, left eye: Secondary | ICD-10-CM | POA: Diagnosis not present

## 2021-11-21 DIAGNOSIS — Z79899 Other long term (current) drug therapy: Secondary | ICD-10-CM | POA: Diagnosis not present

## 2021-11-21 DIAGNOSIS — H538 Other visual disturbances: Secondary | ICD-10-CM | POA: Diagnosis not present

## 2021-11-21 DIAGNOSIS — H2512 Age-related nuclear cataract, left eye: Secondary | ICD-10-CM | POA: Diagnosis not present

## 2021-12-12 ENCOUNTER — Other Ambulatory Visit: Payer: Self-pay | Admitting: Family Medicine

## 2021-12-12 DIAGNOSIS — Z1231 Encounter for screening mammogram for malignant neoplasm of breast: Secondary | ICD-10-CM

## 2021-12-16 DIAGNOSIS — R238 Other skin changes: Secondary | ICD-10-CM | POA: Diagnosis not present

## 2021-12-16 DIAGNOSIS — E1169 Type 2 diabetes mellitus with other specified complication: Secondary | ICD-10-CM | POA: Diagnosis not present

## 2021-12-16 DIAGNOSIS — I10 Essential (primary) hypertension: Secondary | ICD-10-CM | POA: Diagnosis not present

## 2021-12-16 DIAGNOSIS — N644 Mastodynia: Secondary | ICD-10-CM | POA: Diagnosis not present

## 2021-12-19 ENCOUNTER — Other Ambulatory Visit: Payer: Self-pay | Admitting: Family Medicine

## 2021-12-19 DIAGNOSIS — N644 Mastodynia: Secondary | ICD-10-CM

## 2022-01-05 ENCOUNTER — Inpatient Hospital Stay: Admission: RE | Admit: 2022-01-05 | Payer: Medicare Other | Source: Ambulatory Visit

## 2022-01-05 ENCOUNTER — Other Ambulatory Visit: Payer: Medicare Other

## 2022-01-25 DIAGNOSIS — H40013 Open angle with borderline findings, low risk, bilateral: Secondary | ICD-10-CM | POA: Diagnosis not present

## 2022-01-25 DIAGNOSIS — H02831 Dermatochalasis of right upper eyelid: Secondary | ICD-10-CM | POA: Diagnosis not present

## 2022-01-25 DIAGNOSIS — Z961 Presence of intraocular lens: Secondary | ICD-10-CM | POA: Diagnosis not present

## 2022-01-25 DIAGNOSIS — H02834 Dermatochalasis of left upper eyelid: Secondary | ICD-10-CM | POA: Diagnosis not present

## 2022-03-02 ENCOUNTER — Ambulatory Visit
Admission: RE | Admit: 2022-03-02 | Discharge: 2022-03-02 | Disposition: A | Discharge status: Home / Self Care | Payer: 59 | Source: Ambulatory Visit | Attending: Family Medicine | Admitting: Family Medicine

## 2022-03-02 DIAGNOSIS — N644 Mastodynia: Secondary | ICD-10-CM | POA: Diagnosis not present

## 2022-03-13 ENCOUNTER — Emergency Department (HOSPITAL_COMMUNITY)
Admission: EM | Admit: 2022-03-13 | Discharge: 2022-03-13 | Disposition: A | Discharge status: Home / Self Care | Payer: 59 | Attending: Emergency Medicine | Admitting: Emergency Medicine

## 2022-03-13 ENCOUNTER — Other Ambulatory Visit: Payer: Self-pay

## 2022-03-13 ENCOUNTER — Encounter (HOSPITAL_COMMUNITY): Payer: Self-pay | Admitting: Emergency Medicine

## 2022-03-13 ENCOUNTER — Emergency Department (HOSPITAL_COMMUNITY): Payer: 59

## 2022-03-13 DIAGNOSIS — R519 Headache, unspecified: Secondary | ICD-10-CM | POA: Diagnosis not present

## 2022-03-13 DIAGNOSIS — R110 Nausea: Secondary | ICD-10-CM | POA: Diagnosis not present

## 2022-03-13 DIAGNOSIS — S0990XA Unspecified injury of head, initial encounter: Secondary | ICD-10-CM | POA: Diagnosis not present

## 2022-03-13 DIAGNOSIS — Z96652 Presence of left artificial knee joint: Secondary | ICD-10-CM | POA: Insufficient documentation

## 2022-03-13 DIAGNOSIS — W01198A Fall on same level from slipping, tripping and stumbling with subsequent striking against other object, initial encounter: Secondary | ICD-10-CM | POA: Insufficient documentation

## 2022-03-13 DIAGNOSIS — R42 Dizziness and giddiness: Secondary | ICD-10-CM | POA: Insufficient documentation

## 2022-03-13 DIAGNOSIS — I10 Essential (primary) hypertension: Secondary | ICD-10-CM | POA: Insufficient documentation

## 2022-03-13 DIAGNOSIS — E119 Type 2 diabetes mellitus without complications: Secondary | ICD-10-CM | POA: Diagnosis not present

## 2022-03-13 DIAGNOSIS — Z79899 Other long term (current) drug therapy: Secondary | ICD-10-CM | POA: Insufficient documentation

## 2022-03-13 DIAGNOSIS — W19XXXA Unspecified fall, initial encounter: Secondary | ICD-10-CM

## 2022-03-13 DIAGNOSIS — S199XXA Unspecified injury of neck, initial encounter: Secondary | ICD-10-CM | POA: Diagnosis not present

## 2022-03-13 MED ORDER — ACETAMINOPHEN 500 MG PO TABS
1000.0000 mg | ORAL_TABLET | Freq: Once | ORAL | Status: AC
  Administered 2022-03-13: 1000 mg via ORAL
  Filled 2022-03-13: qty 2

## 2022-03-13 MED ORDER — CYCLOBENZAPRINE HCL 10 MG PO TABS: 10.0000 mg | ORAL_TABLET | Freq: Two times a day (BID) | ORAL | 0 refills | Status: DC | PRN

## 2022-03-13 MED ORDER — NAPROXEN 500 MG PO TABS: 500.0000 mg | ORAL_TABLET | Freq: Two times a day (BID) | ORAL | 0 refills | Status: DC

## 2022-03-13 NOTE — ED Provider Triage Note (Signed)
Emergency Medicine Provider Triage Evaluation Note  Erica Buckley , a 82 y.o. female  was evaluated in triage.  Pt complains of headache, nausea, and dizziness after a head injury that occurred prior to arrival.  Patient slipped and hit her head on a cabinet.  No LOC.  She is not currently blood thinners.  Patient admits to changes in vision however, had recent cataract surgery.  No speech changes.  No unilateral weakness.  Review of Systems  Positive: Headache, dizziness Negative: Speech changes  Physical Exam  BP 131/64 (BP Location: Right Arm)   Pulse 63   Temp 98.4 F (36.9 C) (Oral)   Resp 16   Ht 5' 6"$  (1.676 m)   Wt 99.3 kg   SpO2 99%   BMI 35.35 kg/m  Gen:   Awake, no distress   Resp:  Normal effort  MSK:   Moves extremities without difficulty  Other:  No facial droop, normal speech, equal grip strength  Medical Decision Making  Medically screening exam initiated at 4:35 PM.  Appropriate orders placed.  Dennard Nip Herald was informed that the remainder of the evaluation will be completed by another provider, this initial triage assessment does not replace that evaluation, and the importance of remaining in the ED until their evaluation is complete.  Called CT scan to get patient to scanner. Patient brought to CT from triage to rule out bleed. No focal deficits.    Suzy Bouchard, [place] 03/13/22 1637

## 2022-03-13 NOTE — Discharge Instructions (Addendum)
Please take tylenol/ibuprofen or flexeril for pain. I recommend close follow-up with PCP for reevaluation.  Please do not hesitate to return to emergency department if worrisome signs symptoms we discussed become apparent.

## 2022-03-13 NOTE — ED Provider Notes (Signed)
[facility] Provider Note   CSN: [id number] Arrival date & time: 03/13/22  1604     History  Chief Complaint  Patient presents with   Erica Buckley is a 82 y.o. female with a past medical history of anemia, diabetes, hypertension presenting today for evaluation after fall.  Patient reports she tripped on soapy water, fell backward, hit the right side of her head to the TV stand and landed on her buttocks.  She complains of headache, dizziness.  She endorses nausea without vomiting.  She denies any vision changes or loss of consciousness.  She is not taking any blood thinner.  She denies any hematoma or laceration elsewhere.   Fall    Past Medical History:  Diagnosis Date   Anemia yrs ago   Bartholin cyst    Diabetes mellitus without complication (HCC)    fasting 110s type 2   Hypertension    Osteoarthritis    PONV (postoperative nausea and vomiting)    nausea only   Ulcer    Varicose veins    both legs   Vertigo    Past Surgical History:  Procedure Laterality Date   ABDOMINAL HYSTERECTOMY     APPENDECTOMY     BARTHOLIN CYST MARSUPIALIZATION Right 02/28/2018   Procedure: BARTHOLIN CYST MARSUPIALIZATION WITH POSSIBLE BIOPSY;  Surgeon: Isabel Caprice, MD;  Location: [facility];  Service: Gynecology;  Laterality: Right;   CHOLECYSTECTOMY     About 10 years ago   ENDOVENOUS ABLATION SAPHENOUS VEIN W/ LASER  02/29/2012   right small saphenous vein  by Curt Jews MD   ENDOVENOUS ABLATION SAPHENOUS VEIN W/ LASER Left 04/11/2012   left greater saphenous by Curt Jews MD   KNEE ARTHROSCOPY Left    stab phlebectomy  Right 02/05/2020   stab phlebectomy 10-20 incisions right leg by Gae Gallop MD    TOTAL KNEE ARTHROPLASTY Left 08/25/2013   Procedure: LEFT TOTAL KNEE ARTHROPLASTY;  Surgeon: Alta Corning, MD;  Location: [facility];  Service: Orthopedics;  Laterality: Left;   VEIN LIGATION AND  STRIPPING  1978     Home Medications Prior to Admission medications   Medication Sig Start Date End Date Taking? Authorizing Provider  cyclobenzaprine (FLEXERIL) 10 MG tablet Take 1 tablet (10 mg total) by mouth 2 (two) times daily as needed for muscle spasms. 03/13/22  Yes Rex Kras, PA  naproxen (NAPROSYN) 500 MG tablet Take 1 tablet (500 mg total) by mouth 2 (two) times daily. 03/13/22  Yes Rex Kras, PA  B Complex-C (B-COMPLEX WITH VITAMIN C) tablet Take 1 tablet by mouth every other day.     [provider]  diclofenac sodium (VOLTAREN) 1 % GEL Apply 2 g topically 2 (two) times daily.    [provider]  empagliflozin (JARDIANCE) 10 MG TABS tablet Take 10 mg by mouth daily.    [provider]  folic acid (FOLVITE) A999333 MCG tablet Take 400 mcg by mouth every Monday, Wednesday, and Friday. Patient not taking: Reported on 11/01/2020    [provider]  ibuprofen (ADVIL,MOTRIN) 800 MG tablet Take 800 mg by mouth every 8 (eight) hours as needed for moderate pain.    [provider]  losartan-hydrochlorothiazide (HYZAAR) 50-12.5 MG per tablet Take 1 tablet by mouth daily.    [provider]  metoprolol succinate (TOPROL-XL) 25 MG 24 hr tablet Take 25 mg by mouth daily.    [provider]  TRADJENTA 5 MG TABS tablet  11/16/19   [provider]      Allergies    Codeine, Diovan [valsartan], Other, and Pioglitazone    Review of Systems   Review of Systems Negative except as per HPI.  Physical Exam Updated Vital Signs BP (!) 141/77   Pulse (!) 55   Temp 98 F (36.7 C)   Resp 18   Ht 5' 6"$  (1.676 m)   Wt 99.3 kg   SpO2 97%   BMI 35.35 kg/m  Physical Exam Vitals and nursing note reviewed.  Constitutional:      Appearance: Normal appearance.  HENT:     Head: Normocephalic and atraumatic.     Mouth/Throat:     Mouth: Mucous membranes are moist.  Eyes:     General: No scleral icterus. Cardiovascular:     Rate and  Rhythm: Normal rate and regular rhythm.     Pulses: Normal pulses.     Heart sounds: Normal heart sounds.  Pulmonary:     Effort: Pulmonary effort is normal.     Breath sounds: Normal breath sounds.  Abdominal:     General: Abdomen is flat.     Palpations: Abdomen is soft.     Tenderness: There is no abdominal tenderness.  Musculoskeletal:        General: No deformity.     Comments: TTP to R temporal area.  Skin:    General: Skin is warm.     Findings: No rash.  Neurological:     General: No focal deficit present.     Mental Status: She is alert.  Psychiatric:        Mood and Affect: Mood normal.     ED Results / Procedures / Treatments   Labs (all labs ordered are listed, but only abnormal results are displayed) Labs Reviewed - No data to display  EKG None  Radiology CT Head Wo Contrast  Result Date: 03/13/2022 CLINICAL DATA:  Head trauma, minor (Age >= 65y); Neck trauma (Age >= 65y). Headache, nausea, and dizziness. EXAM: CT HEAD WITHOUT CONTRAST CT CERVICAL SPINE WITHOUT CONTRAST TECHNIQUE: Multidetector CT imaging of the head and cervical spine was performed following the standard protocol without intravenous contrast. Multiplanar CT image reconstructions of the cervical spine were also generated. RADIATION DOSE REDUCTION: This exam was performed according to the departmental dose-optimization program which includes automated exposure control, adjustment of the mA and/or kV according to patient size and/or use of iterative reconstruction technique. COMPARISON:  Head MRI 11/22/2020.  Cervical spine CT 01/13/2010. FINDINGS: CT HEAD FINDINGS Brain: There is no evidence of an acute infarct, intracranial hemorrhage, mass, midline shift, or extra-axial fluid collection. The ventricles and sulci are normal. Hypodensities at the inferior aspect of the lentiform nuclei bilaterally correspond to perivascular spaces on MRI. Vascular: No hyperdense vessel or unexpected calcification.  Skull: No acute fracture or suspicious osseous lesion. Sinuses/Orbits: Visualized paranasal sinuses and mastoid air cells are clear. Bilateral cataract extraction. Other: None. CT CERVICAL SPINE FINDINGS Alignment: Chronic cervical spine straightening. Unchanged grade 1 anterolisthesis of C7 on T1. Skull base and vertebrae: No acute fracture or suspicious osseous lesion. Soft tissues and spinal canal: No prevertebral fluid or swelling. No visible canal hematoma. Disc levels: Moderately advanced disc degeneration throughout the mid and lower cervical spine. Moderate facet arthrosis at C7-T1, left greater than right. Upper chest: Clear lung apices. Other: Unchanged 1.1 cm right thyroid nodule for which no follow-up imaging is recommended. IMPRESSION: 1. Negative  head CT. 2. No acute cervical spine fracture. Electronically Signed   By: Logan Bores M.D.   On: 03/13/2022 17:06   CT Cervical Spine Wo Contrast  Result Date: 03/13/2022 CLINICAL DATA:  Head trauma, minor (Age >= 65y); Neck trauma (Age >= 65y). Headache, nausea, and dizziness. EXAM: CT HEAD WITHOUT CONTRAST CT CERVICAL SPINE WITHOUT CONTRAST TECHNIQUE: Multidetector CT imaging of the head and cervical spine was performed following the standard protocol without intravenous contrast. Multiplanar CT image reconstructions of the cervical spine were also generated. RADIATION DOSE REDUCTION: This exam was performed according to the departmental dose-optimization program which includes automated exposure control, adjustment of the mA and/or kV according to patient size and/or use of iterative reconstruction technique. COMPARISON:  Head MRI 11/22/2020.  Cervical spine CT 01/13/2010. FINDINGS: CT HEAD FINDINGS Brain: There is no evidence of an acute infarct, intracranial hemorrhage, mass, midline shift, or extra-axial fluid collection. The ventricles and sulci are normal. Hypodensities at the inferior aspect of the lentiform nuclei bilaterally correspond to  perivascular spaces on MRI. Vascular: No hyperdense vessel or unexpected calcification. Skull: No acute fracture or suspicious osseous lesion. Sinuses/Orbits: Visualized paranasal sinuses and mastoid air cells are clear. Bilateral cataract extraction. Other: None. CT CERVICAL SPINE FINDINGS Alignment: Chronic cervical spine straightening. Unchanged grade 1 anterolisthesis of C7 on T1. Skull base and vertebrae: No acute fracture or suspicious osseous lesion. Soft tissues and spinal canal: No prevertebral fluid or swelling. No visible canal hematoma. Disc levels: Moderately advanced disc degeneration throughout the mid and lower cervical spine. Moderate facet arthrosis at C7-T1, left greater than right. Upper chest: Clear lung apices. Other: Unchanged 1.1 cm right thyroid nodule for which no follow-up imaging is recommended. IMPRESSION: 1. Negative head CT. 2. No acute cervical spine fracture. Electronically Signed   By: Logan Bores M.D.   On: 03/13/2022 17:06    Procedures Procedures    Medications Ordered in ED Medications  acetaminophen (TYLENOL) tablet 1,000 mg (has no administration in time range)    ED Course/ Medical Decision Making/ A&P                             Medical Decision Making Risk OTC drugs. Prescription drug management.   This patient presents to the ED for fall, this involves an extensive number of treatment options, and is a complaint that carries with a high risk of complications and morbidity.  The differential diagnosis includes ICH/CVA, fracture, dislocation, laceration, hematoma.  This is not an exhaustive list.  Imaging studies: I ordered imaging studies. I personally reviewed, interpreted imaging and agree with the radiologist's interpretations. The results include: CT head and CT cervical spine negative.  Problem list/ ED course/ Critical interventions/ Medical management: HPI: See above Vital signs within normal range and stable throughout  visit. Laboratory/imaging studies significant for: See above. On physical examination, patient is afebrile and appears in no acute distress. This patient presenting with head trauma. Given mechanism, history, and physical exam findings, I have a low suspicion for intracranial hemorrhage, basilar skull fracture, increased intracranial pressure/impending herniation, non-accidental trauma or c-spine injury. Patient's GCS is 15, mechanism is low energy and there is no history of LOC. Patient is well appearing and tolerating PO with no other injuries. Patient is safe for DC home at this time. Patient and family were advised to f/u with PCP and instructed on appropriate return precautions. I have reviewed the patient home medicines and have made adjustments  as needed.  Cardiac monitoring/EKG: The patient was maintained on a cardiac monitor.  I personally reviewed and interpreted the cardiac monitor which showed an underlying rhythm of: sinus rhythm.  Additional history obtained: External records from outside source obtained and reviewed including: Chart review including previous notes, labs, imaging.  Disposition Continued outpatient therapy. Follow-up with PCP recommended for reevaluation of symptoms. Treatment plan discussed with patient.  Pt acknowledged understanding was agreeable to the plan. Worrisome signs and symptoms were discussed with patient, and patient acknowledged understanding to return to the ED if they noticed these signs and symptoms. Patient was stable upon discharge.   This chart was dictated using voice recognition software.  Despite best efforts to proofread,  errors can occur which can change the documentation meaning.          Final Clinical Impression(s) / ED Diagnoses Final diagnoses:  Fall, initial encounter  Injury of head, initial encounter    Rx / DC Orders ED Discharge Orders          Ordered    naproxen (NAPROSYN) 500 MG tablet  2 times daily         03/13/22 2014    cyclobenzaprine (FLEXERIL) 10 MG tablet  2 times daily PRN        03/13/22 2014              Rex Kras, PA 03/14/22 1109    Sherwood Gambler, MD 03/17/22 [phone]

## 2022-03-13 NOTE — ED Triage Notes (Signed)
1.5 hour ago. Pt slipped on some liquid in floor and fell hit head just behind right ear on tv cabinet. Denies LOC, Denies blood thinners. Pt complains of headache, dizziness and nausea during triage.

## 2022-04-26 DIAGNOSIS — H26493 Other secondary cataract, bilateral: Secondary | ICD-10-CM | POA: Diagnosis not present

## 2022-04-26 DIAGNOSIS — E119 Type 2 diabetes mellitus without complications: Secondary | ICD-10-CM | POA: Diagnosis not present

## 2022-06-14 DIAGNOSIS — E1169 Type 2 diabetes mellitus with other specified complication: Secondary | ICD-10-CM | POA: Diagnosis not present

## 2022-06-14 DIAGNOSIS — Z800 Family history of malignant neoplasm of digestive organs: Secondary | ICD-10-CM | POA: Diagnosis not present

## 2022-06-14 DIAGNOSIS — I10 Essential (primary) hypertension: Secondary | ICD-10-CM | POA: Diagnosis not present

## 2022-06-14 DIAGNOSIS — Z0000 Encounter for general adult medical examination without abnormal findings: Secondary | ICD-10-CM | POA: Diagnosis not present

## 2022-06-14 DIAGNOSIS — Z9181 History of falling: Secondary | ICD-10-CM | POA: Diagnosis not present

## 2022-06-14 DIAGNOSIS — E119 Type 2 diabetes mellitus without complications: Secondary | ICD-10-CM | POA: Diagnosis not present

## 2022-06-16 ENCOUNTER — Other Ambulatory Visit: Payer: Self-pay | Admitting: Family Medicine

## 2022-06-16 DIAGNOSIS — E2839 Other primary ovarian failure: Secondary | ICD-10-CM

## 2022-08-01 ENCOUNTER — Telehealth: Payer: Self-pay | Admitting: Diagnostic Neuroimaging

## 2022-08-01 NOTE — Telephone Encounter (Signed)
Pt said 2 weeks ago started having sensation on left side of head, vertigo, nausea. PCP prescribed meclizine, but it starts up again. Would like a call back.

## 2022-08-02 NOTE — Telephone Encounter (Signed)
LVM for pt to call the office to schedule an appt. °

## 2022-09-13 ENCOUNTER — Ambulatory Visit: Payer: 59 | Admitting: Adult Health

## 2022-09-13 ENCOUNTER — Encounter: Payer: Self-pay | Admitting: Adult Health

## 2022-09-13 VITALS — Ht 66.0 in | Wt 220.0 lb

## 2022-09-13 DIAGNOSIS — R42 Dizziness and giddiness: Secondary | ICD-10-CM

## 2022-09-13 NOTE — Progress Notes (Signed)
[facility]  PATIENT: Erica Buckley DOB: 09-25-1940  REFERRING CLINICIAN: Laurann Montana, MD HISTORY FROM: patient  REASON FOR VISIT:, Vertigo and nausea   HISTORICAL  CHIEF COMPLAINT:  Chief Complaint  Patient presents with   Follow-up    Rm 3, here alone Pt is here for dizziness. Pt states she has dizziness, that comes in waves. States she has nausea during this episodes. Pt also states she has headaches in the morning, they go away as the day goes by.     HISTORY OF PRESENT ILLNESS:    Update 09/13/2022 JM: Patient returns per request after prior visit almost 2 years ago.  Reports mid June had episode of vertigo that lasted for 2 week duration.  Denies any significant headache, vision changes or weakness.  Denies any hearing loss or tinnitus.  She has continued to have intermittent vertigo since prior visit but usually only lasts a couple of days.  Symptoms are debilitating and unable to drive as quick head movements worsen symptoms. Also turning in bed can worsen vertigo.  Usually associated with nausea.  Previously completed MRI brain for similar symptoms in 10/2020 which was unremarkable.  She did have a fall back in February where she hit the back of her head, evaluated in ED with [facility] and cervical unremarkable.  She has been experiencing a dull headache since that time.  She does admit to increased stressors especially after moving her 32 year old mother and to live with her who has Alzheimer's dementia.  She is her main caregiver, she has one sister that is present at doctors visits but lives in [place]. Reports her other family members criticize her and tell her she is doing things wrong which also has been causing increased stress.      Consult visit 11/01/2020 Dr. Marjory Lies: 82 year old female here for evaluation of abnormal head sensations.  For past 3 to 6 months patient has had onset of left-sided abnormal sensation in the head, with an  opening closing sensation, rising sensation, then sharp pain in the left eye.  Episodes happening several times per day.  Sometimes she has these on a daily basis sometimes she can go 1 to 2 weeks without attacks.  She has some intermittent nausea.  Has been remittent vertigo.  Has been under more stress lately related to her 65 year old mother with dementia and declining health.  Patient has remote history of migraine in the 1970s when she was going through a divorce.  She had severe headaches with photophobia, nausea.   REVIEW OF SYSTEMS: Full 14 system review of systems performed and negative with exception of: as per HPI.  ALLERGIES: Allergies  Allergen Reactions   Codeine Shortness Of Breath    vomiting   Diovan [Valsartan] Cough    Increased stool   Other     No blood products, jehovah witness   Pioglitazone Other (See Comments)    Wgt gain, gas, bloating    HOME MEDICATIONS: Outpatient Medications Prior to Visit  Medication Sig Dispense Refill   B Complex-C (B-COMPLEX WITH VITAMIN C) tablet Take 1 tablet by mouth every other day.      diclofenac sodium (VOLTAREN) 1 % GEL Apply 2 g topically 2 (two) times daily.     Empagliflozin-linaGLIPtin (GLYXAMBI) 10-5 MG TABS Take by mouth.     ibuprofen (ADVIL,MOTRIN) 800 MG tablet Take 800 mg by mouth every 8 (eight) hours as needed for moderate pain.     losartan-hydrochlorothiazide (HYZAAR) 50-12.5 MG per tablet Take  1 tablet by mouth daily.     metoprolol succinate (TOPROL-XL) 25 MG 24 hr tablet Take 25 mg by mouth daily.     TRADJENTA 5 MG TABS tablet      naproxen (NAPROSYN) 500 MG tablet Take 1 tablet (500 mg total) by mouth 2 (two) times daily. 30 tablet 0   cyclobenzaprine (FLEXERIL) 10 MG tablet Take 1 tablet (10 mg total) by mouth 2 (two) times daily as needed for muscle spasms. 20 tablet 0   empagliflozin (JARDIANCE) 10 MG TABS tablet Take 10 mg by mouth daily.     folic acid (FOLVITE) 400 MCG tablet Take 400 mcg by mouth  every Monday, Wednesday, and Friday. (Patient not taking: Reported on 11/01/2020)     No facility-administered medications prior to visit.    PAST MEDICAL HISTORY: Past Medical History:  Diagnosis Date   Anemia yrs ago   Bartholin cyst    Diabetes mellitus without complication (HCC)    fasting 110s type 2   Hypertension    Osteoarthritis    PONV (postoperative nausea and vomiting)    nausea only   Ulcer    Varicose veins    both legs   Vertigo     PAST SURGICAL HISTORY: Past Surgical History:  Procedure Laterality Date   ABDOMINAL HYSTERECTOMY     APPENDECTOMY     BARTHOLIN CYST MARSUPIALIZATION Right 02/28/2018   Procedure: BARTHOLIN CYST MARSUPIALIZATION WITH POSSIBLE BIOPSY;  Surgeon: Shonna Chock, MD;  Location: [facility];  Service: Gynecology;  Laterality: Right;   CHOLECYSTECTOMY     About 10 years ago   ENDOVENOUS ABLATION SAPHENOUS VEIN W/ LASER  02/29/2012   right small saphenous vein  by Gretta Began MD   ENDOVENOUS ABLATION SAPHENOUS VEIN W/ LASER Left 04/11/2012   left greater saphenous by Gretta Began MD   KNEE ARTHROSCOPY Left    stab phlebectomy  Right 02/05/2020   stab phlebectomy 10-20 incisions right leg by Cari Caraway MD    TOTAL KNEE ARTHROPLASTY Left 08/25/2013   Procedure: LEFT TOTAL KNEE ARTHROPLASTY;  Surgeon: Harvie Junior, MD;  Location: [facility];  Service: Orthopedics;  Laterality: Left;   VEIN LIGATION AND STRIPPING  1978    FAMILY HISTORY: Family History  Problem Relation Age of Onset   Hypertension Mother    Dementia Mother    Cancer Father    Diabetes Father    Pancreatic cancer Father    Colon cancer Sister    Diabetes Brother    Prostate cancer Brother    Prostate cancer Brother    Diabetes Maternal Grandfather    Heart disease Maternal Grandfather     SOCIAL HISTORY: Social History   Socioeconomic History   Marital status: Widowed    Spouse name: Not on file   Number of children: 3   Years of education:  Not on file   Highest education level: Associate degree: occupational, Scientist, product/process development, or vocational program  Occupational History   Not on file  Tobacco Use   Smoking status: Never   Smokeless tobacco: Never  Vaping Use   Vaping status: Never Used  Substance and Sexual Activity   Alcohol use: Yes    Alcohol/week: 1.0 standard drink of alcohol    Types: 1 Glasses of wine per week    Comment: occassionally   Drug use: No   Sexual activity: Not on file  Other Topics Concern   Not on file  Social History Narrative   11/01/20 grand  dgtr with her   Caffeine- 1 coffee, occas soda, tea   Social Determinants of Health   Financial Resource Strain: Low Risk  (11/11/2018)   Overall Financial Resource Strain (CARDIA)    Difficulty of Paying Living Expenses: Not hard at all  Food Insecurity: No Food Insecurity (03/28/2021)   Hunger Vital Sign    Worried About Running Out of Food in the Last Year: Never true    Ran Out of Food in the Last Year: Never true  Transportation Needs: No Transportation Needs (06/08/2021)   PRAPARE - Administrator, Civil Service (Medical): No    Lack of Transportation (Non-Medical): No  Physical Activity: Not on file  Stress: No Stress Concern Present (11/04/2019)   [employer] of Occupational Health - Occupational Stress Questionnaire    Feeling of Stress : Not at all  Social Connections: Not on file  Intimate Partner Violence: Not on file     PHYSICAL EXAM  GENERAL EXAM/CONSTITUTIONAL: Vitals:  Vitals:   09/13/22 1333  Weight: 220 lb (99.8 kg)  Height: 5\' 6"  (1.676 m)    Body mass index is 35.51 kg/m. Wt Readings from Last 3 Encounters:  09/13/22 220 lb (99.8 kg)  03/13/22 219 lb (99.3 kg)  11/01/20 226 lb 3.2 oz (102.6 kg)   Patient is in no distress; well developed, nourished and groomed; neck is supple  CARDIOVASCULAR: Examination of carotid arteries is normal; no carotid bruits Regular rate and rhythm, no murmurs Examination  of peripheral vascular system by observation and palpation is normal  EYES: Ophthalmoscopic exam of optic discs and posterior segments is normal; no papilledema or hemorrhages No results found.  MUSCULOSKELETAL: Gait, strength, tone, movements noted in Neurologic exam below  NEUROLOGIC: MENTAL STATUS:      No data to display         awake, alert, oriented to person, place and time recent and remote memory intact normal attention and concentration language fluent, comprehension intact, naming intact fund of knowledge appropriate  CRANIAL NERVE:  2nd - no papilledema on fundoscopic exam 2nd, 3rd, 4th, 6th - pupils equal and reactive to light, visual fields full to confrontation, extraocular muscles intact, no nystagmus 5th - facial sensation symmetric 7th - facial strength symmetric 8th - hearing intact 9th - palate elevates symmetrically, uvula midline 11th - shoulder shrug symmetric 12th - tongue protrusion midline  MOTOR:  normal bulk and tone, full strength in the BUE, BLE  SENSORY:  normal and symmetric to light touch, temperature, vibration  COORDINATION:  finger-nose-finger, fine finger movements normal  REFLEXES:  deep tendon reflexes present and symmetric  GAIT/STATION:  narrow based gait     DIAGNOSTIC DATA (LABS, IMAGING, TESTING) - I reviewed patient records, labs, notes, testing and imaging myself where available.  Lab Results  Component Value Date   WBC 5.9 03/11/2018   HGB 12.2 03/11/2018   HCT 39.6 03/11/2018   MCV 81.1 03/11/2018   PLT 174 03/11/2018      Component Value Date/Time   NA 141 11/01/2020 1100   K 3.7 11/01/2020 1100   CL 102 11/01/2020 1100   CO2 24 11/01/2020 1100   GLUCOSE 147 (H) 11/01/2020 1100   GLUCOSE 164 (H) 03/11/2018 0112   BUN 15 11/01/2020 1100   CREATININE 0.93 11/01/2020 1100   CALCIUM 9.7 11/01/2020 1100   PROT 7.7 11/01/2020 1100   ALBUMIN 4.7 11/01/2020 1100   AST 16 11/01/2020 1100   ALT 15  11/01/2020 1100  ALKPHOS 91 11/01/2020 1100   BILITOT 0.2 11/01/2020 1100   GFRNONAA 52 (L) 03/11/2018 0112   GFRAA >60 03/11/2018 0112   No results found for: "CHOL", "HDL", "LDLCALC", "LDLDIRECT", "TRIG", "CHOLHDL" No results found for: "HGBA1C" No results found for: "VITAMINB12" No results found for: "TSH"   02/26/19 MRA head  - Normal MRA of the head.    ASSESSMENT AND PLAN  82 y.o. year old female here with recurrent vertigo with nausea, recent prolonged 2-week episode, usually only lasting 2 days.  No reoccurrence over the past month. Not associated with headache.    Dx:  1. Vertigo      PLAN:  VERTIGO, suspect BPPV -Referral placed to vestibular rehab -Suspect recent prolonged episode in setting of family stressors and being main caregiver of her mother who has Alzheimer's dementia -MRI brain 10/2020 unremarkable (completed for prior c/o left-sided head abnormal sensation, eye pain, vertigo and nausea) -No indication for repeat imaging at this time, neuroexam intact -Denies any significant headaches, does have mild headache since fall in 03/2022, CTH and cervical unremarkable    Orders Placed This Encounter  Procedures   Ambulatory referral to Physical Therapy   Return if symptoms worsen or fail to improve.    I spent 30 minutes of face-to-face and non-face-to-face time with patient.  This included previsit chart review, lab review, study review, order entry, electronic health record documentation, patient education and discussion regarding above diagnoses and treatment plan and answered all other questions to patient's satisfaction  Ihor Austin, [facility]  [facility] [address] [place], [place] [address]  Phone [phone] Fax [phone] Note: This document was prepared with digital dictation and possible smart phrase technology. Any transcriptional errors that result from this process are unintentional.

## 2022-09-13 NOTE — Patient Instructions (Addendum)
Your Plan:  Please call with any additional vertigo episodes   Referral placed to PT for vestibular rehab - please call them to schedule        Thank you for coming to see [place] at [facility]. I hope we have been able to provide you high quality care today.  You may receive a patient satisfaction survey over the next few weeks. We would appreciate your feedback and comments so that we may continue to improve ourselves and the health of our patients.     Benign Positional Vertigo Vertigo is the feeling that you or your surroundings are moving when they are not. Benign positional vertigo is the most common form of vertigo. This is usually a harmless condition (benign). This condition is positional. This means that symptoms are triggered by certain movements and positions. This condition can be dangerous if it occurs while you are doing something that could cause harm to yourself or others. This includes activities such as driving or operating machinery. What are the causes? The inner ear has fluid-filled canals that help your brain sense movement and balance. When the fluid moves, the brain receives messages about your body's position. With benign positional vertigo, calcium crystals in the inner ear break free and disturb the inner ear area. This causes your brain to receive confusing messages about your body's position. What increases the risk? You are more likely to develop this condition if: You are a woman. You are 41 years of age or older. You have recently had a head injury. You have an inner ear disease. What are the signs or symptoms? Symptoms of this condition usually happen when you move your head or your eyes in different directions. Symptoms may start suddenly and usually last for less than a minute. They include: Loss of balance and falling. Feeling like you are spinning or moving. Feeling like your surroundings are spinning or moving. Nausea and  vomiting. Blurred vision. Dizziness. Involuntary eye movement (nystagmus). Symptoms can be mild and cause only minor problems, or they can be severe and interfere with daily life. Episodes of benign positional vertigo may return (recur) over time. Symptoms may also improve over time. How is this diagnosed? This condition may be diagnosed based on: Your medical history. A physical exam of the head, neck, and ears. Positional tests to check for or stimulate vertigo. You may be asked to turn your head and change positions, such as going from sitting to lying down. A health care provider will watch for symptoms of vertigo. You may be referred to a health care provider who specializes in ear, nose, and throat problems (ENT or otolaryngologist) or a provider who specializes in disorders of the nervous system (neurologist). How is this treated?  This condition may be treated in a session in which your health care provider moves your head in specific positions to help the displaced crystals in your inner ear move. Treatment for this condition may take several sessions. Surgery may be needed in severe cases, but this is rare. In some cases, benign positional vertigo may resolve on its own in 2-4 weeks. Follow these instructions at home: Safety Move slowly. Avoid sudden body or head movements or certain positions, as told by your health care provider. Avoid driving or operating machinery until your health care provider says it is safe. Avoid doing any tasks that would be dangerous to you or others if vertigo occurs. If you have trouble walking or keeping your balance, try using a  cane for stability. If you feel dizzy or unstable, sit down right away. Return to your normal activities as told by your health care provider. Ask your health care provider what activities are safe for you. General instructions Take over-the-counter and prescription medicines only as told by your health care provider. Drink  enough fluid to keep your urine pale yellow. Keep all follow-up visits. This is important. Contact a health care provider if: You have a fever. Your condition gets worse or you develop new symptoms. Your family or friends notice any behavioral changes. You have nausea or vomiting that gets worse. You have numbness or a prickling and tingling sensation. Get help right away if you: Have difficulty speaking or moving. Are always dizzy or faint. Develop severe headaches. Have weakness in your legs or arms. Have changes in your hearing or vision. Develop a stiff neck. Develop sensitivity to light. These symptoms may represent a serious problem that is an emergency. Do not wait to see if the symptoms will go away. Get medical help right away. Call your local emergency services (911 in the [place] Do not drive yourself to the hospital. Summary Vertigo is the feeling that you or your surroundings are moving when they are not. Benign positional vertigo is the most common form of vertigo. This condition is caused by calcium crystals in the inner ear that become displaced. This causes a disturbance in an area of the inner ear that helps your brain sense movement and balance. Symptoms include loss of balance and falling, feeling that you or your surroundings are moving, nausea and vomiting, and blurred vision. This condition can be diagnosed based on symptoms, a physical exam, and positional tests. Follow safety instructions as told by your health care provider and keep all follow-up visits. This is important. This information is not intended to replace advice given to you by your health care provider. Make sure you discuss any questions you have with your health care provider. Document Revised: 12/17/2019 Document Reviewed: 12/17/2019 Elsevier Patient Education  2024 [employer].

## 2022-09-21 ENCOUNTER — Ambulatory Visit: Payer: Medicare HMO | Admitting: Adult Health

## 2022-09-22 ENCOUNTER — Ambulatory Visit: Payer: 59 | Attending: Adult Health

## 2022-10-05 DIAGNOSIS — E1169 Type 2 diabetes mellitus with other specified complication: Secondary | ICD-10-CM | POA: Diagnosis not present

## 2022-10-14 DIAGNOSIS — J069 Acute upper respiratory infection, unspecified: Secondary | ICD-10-CM | POA: Diagnosis not present

## 2022-10-14 DIAGNOSIS — Z03818 Encounter for observation for suspected exposure to other biological agents ruled out: Secondary | ICD-10-CM | POA: Diagnosis not present

## 2022-10-14 DIAGNOSIS — R051 Acute cough: Secondary | ICD-10-CM | POA: Diagnosis not present

## 2022-10-14 DIAGNOSIS — J029 Acute pharyngitis, unspecified: Secondary | ICD-10-CM | POA: Diagnosis not present

## 2022-10-23 DIAGNOSIS — M62830 Muscle spasm of back: Secondary | ICD-10-CM | POA: Diagnosis not present

## 2023-01-30 ENCOUNTER — Other Ambulatory Visit: Payer: 59

## 2023-02-12 ENCOUNTER — Other Ambulatory Visit: Payer: Self-pay | Admitting: Family Medicine

## 2023-02-12 DIAGNOSIS — Z1231 Encounter for screening mammogram for malignant neoplasm of breast: Secondary | ICD-10-CM

## 2023-02-19 DIAGNOSIS — S92302A Fracture of unspecified metatarsal bone(s), left foot, initial encounter for closed fracture: Secondary | ICD-10-CM | POA: Diagnosis not present

## 2023-02-19 DIAGNOSIS — M25572 Pain in left ankle and joints of left foot: Secondary | ICD-10-CM | POA: Diagnosis not present

## 2023-03-07 ENCOUNTER — Ambulatory Visit: Payer: 59

## 2023-03-26 DIAGNOSIS — S92302A Fracture of unspecified metatarsal bone(s), left foot, initial encounter for closed fracture: Secondary | ICD-10-CM | POA: Diagnosis not present

## 2023-03-28 DIAGNOSIS — E1169 Type 2 diabetes mellitus with other specified complication: Secondary | ICD-10-CM | POA: Diagnosis not present

## 2023-03-28 DIAGNOSIS — I10 Essential (primary) hypertension: Secondary | ICD-10-CM | POA: Diagnosis not present

## 2023-04-04 ENCOUNTER — Ambulatory Visit
Admission: RE | Admit: 2023-04-04 | Discharge: 2023-04-04 | Disposition: A | Discharge status: Home / Self Care | Payer: 59 | Source: Ambulatory Visit | Attending: Family Medicine | Admitting: Family Medicine

## 2023-04-04 DIAGNOSIS — Z1231 Encounter for screening mammogram for malignant neoplasm of breast: Secondary | ICD-10-CM | POA: Diagnosis not present

## 2023-04-16 DIAGNOSIS — S92302A Fracture of unspecified metatarsal bone(s), left foot, initial encounter for closed fracture: Secondary | ICD-10-CM | POA: Diagnosis not present

## 2023-05-03 ENCOUNTER — Other Ambulatory Visit

## 2023-05-30 DIAGNOSIS — S92302A Fracture of unspecified metatarsal bone(s), left foot, initial encounter for closed fracture: Secondary | ICD-10-CM | POA: Diagnosis not present

## 2023-06-20 DIAGNOSIS — E113292 Type 2 diabetes mellitus with mild nonproliferative diabetic retinopathy without macular edema, left eye: Secondary | ICD-10-CM | POA: Diagnosis not present

## 2023-10-02 DIAGNOSIS — I10 Essential (primary) hypertension: Secondary | ICD-10-CM | POA: Diagnosis not present

## 2023-10-02 DIAGNOSIS — E1169 Type 2 diabetes mellitus with other specified complication: Secondary | ICD-10-CM | POA: Diagnosis not present

## 2023-10-02 DIAGNOSIS — M810 Age-related osteoporosis without current pathological fracture: Secondary | ICD-10-CM | POA: Diagnosis not present

## 2023-10-02 DIAGNOSIS — H9202 Otalgia, left ear: Secondary | ICD-10-CM | POA: Diagnosis not present

## 2023-10-02 DIAGNOSIS — M545 Low back pain, unspecified: Secondary | ICD-10-CM | POA: Diagnosis not present

## 2023-10-23 DIAGNOSIS — N390 Urinary tract infection, site not specified: Secondary | ICD-10-CM | POA: Diagnosis not present

## 2023-10-23 DIAGNOSIS — I8393 Asymptomatic varicose veins of bilateral lower extremities: Secondary | ICD-10-CM | POA: Diagnosis not present

## 2023-10-23 DIAGNOSIS — E785 Hyperlipidemia, unspecified: Secondary | ICD-10-CM | POA: Diagnosis not present

## 2023-10-23 DIAGNOSIS — Z1159 Encounter for screening for other viral diseases: Secondary | ICD-10-CM | POA: Diagnosis not present

## 2023-10-23 DIAGNOSIS — E1169 Type 2 diabetes mellitus with other specified complication: Secondary | ICD-10-CM | POA: Diagnosis not present

## 2023-10-23 DIAGNOSIS — I10 Essential (primary) hypertension: Secondary | ICD-10-CM | POA: Diagnosis not present

## 2023-10-23 DIAGNOSIS — I872 Venous insufficiency (chronic) (peripheral): Secondary | ICD-10-CM | POA: Diagnosis not present

## 2023-10-23 DIAGNOSIS — Z79899 Other long term (current) drug therapy: Secondary | ICD-10-CM | POA: Diagnosis not present

## 2023-10-23 DIAGNOSIS — E1142 Type 2 diabetes mellitus with diabetic polyneuropathy: Secondary | ICD-10-CM | POA: Diagnosis not present

## 2023-10-23 DIAGNOSIS — R42 Dizziness and giddiness: Secondary | ICD-10-CM | POA: Diagnosis not present

## 2023-11-06 ENCOUNTER — Encounter: Payer: Self-pay | Admitting: Adult Health Nurse Practitioner

## 2023-11-07 ENCOUNTER — Encounter: Payer: Self-pay | Admitting: Adult Health Nurse Practitioner

## 2023-11-09 ENCOUNTER — Encounter: Payer: Self-pay | Admitting: Adult Health Nurse Practitioner

## 2024-02-28 ENCOUNTER — Telehealth: Admitting: Emergency Medicine

## 2024-02-28 DIAGNOSIS — J329 Chronic sinusitis, unspecified: Secondary | ICD-10-CM | POA: Diagnosis not present

## 2024-02-28 MED ORDER — AMOXICILLIN-POT CLAVULANATE 875-125 MG PO TABS: 1.0000 | ORAL_TABLET | Freq: Two times a day (BID) | ORAL | 0 refills | Status: AC

## 2024-02-28 MED ORDER — BENZONATATE 100 MG PO CAPS: 100.0000 mg | ORAL_CAPSULE | Freq: Two times a day (BID) | ORAL | 0 refills | Status: AC | PRN

## 2024-02-28 NOTE — Patient Instructions (Signed)
" °  Erica Buckley, thank you for joining Lamar Schlossman, [name] for today's virtual visit.  While this provider is not your primary care provider (PCP), if your PCP is located in our provider database this encounter information will be shared with them immediately following your visit.   A [facility] MyChart account gives you access to today's visit and all your visits, tests, and labs performed at [facility]  click here if you don't have a [facility] MyChart account or go to [email or web address]  Consent: (Patient) Erica Buckley provided verbal consent for this virtual visit at the beginning of the encounter.  Current Medications:  Current Outpatient Medications:    amoxicillin -clavulanate (AUGMENTIN ) 875-125 MG tablet, Take 1 tablet by mouth every 12 (twelve) hours., Disp: 14 tablet, Rfl: 0   benzonatate  (TESSALON ) 100 MG capsule, Take 1 capsule (100 mg total) by mouth 2 (two) times daily as needed for cough., Disp: 20 capsule, Rfl: 0   B Complex-C (B-COMPLEX WITH VITAMIN C) tablet, Take 1 tablet by mouth every other day. , Disp: , Rfl:    diclofenac sodium (VOLTAREN) 1 % GEL, Apply 2 g topically 2 (two) times daily., Disp: , Rfl:    Empagliflozin-linaGLIPtin (GLYXAMBI) 10-5 MG TABS, Take by mouth., Disp: , Rfl:    ibuprofen  (ADVIL ,MOTRIN ) 800 MG tablet, Take 800 mg by mouth every 8 (eight) hours as needed for moderate pain., Disp: , Rfl:    losartan -hydrochlorothiazide  (HYZAAR) 50-12.5 MG per tablet, Take 1 tablet by mouth daily., Disp: , Rfl:    metoprolol  succinate (TOPROL -XL) 25 MG 24 hr tablet, Take 25 mg by mouth daily., Disp: , Rfl:    TRADJENTA 5 MG TABS tablet, , Disp: , Rfl:    Medications ordered in this encounter:  Meds ordered this encounter  Medications   amoxicillin -clavulanate (AUGMENTIN ) 875-125 MG tablet    Sig: Take 1 tablet by mouth every 12 (twelve) hours.    Dispense:  14 tablet    Refill:  0    Supervising Provider:   LAMPTEY,  PHILIP O [[id number]]   benzonatate  (TESSALON ) 100 MG capsule    Sig: Take 1 capsule (100 mg total) by mouth 2 (two) times daily as needed for cough.    Dispense:  20 capsule    Refill:  0    Supervising Provider:   BLAISE ALEENE KIDD [[id number]]     *If you need refills on other medications prior to your next appointment, please contact your pharmacy*  Follow-Up: Call back or seek an in-person evaluation if the symptoms worsen or if the condition fails to improve as anticipated.  [facility] [phone]  Other Instructions   If you have been instructed to have an in-person evaluation today at a local Urgent Care facility, please use the link below. It will take you to a list of all of our available [facility], including address, phone number and hours of operation. Please do not delay care.  [facility]  If you or a family member do not have a primary care provider, use the link below to schedule a visit and establish care. When you choose a [facility] primary care physician or advanced practice provider, you gain a long-term partner in health. Find a Primary Care Provider  Learn more about [facility]'s in-office and virtual care options: [facility] - Get Care Now  "

## 2024-02-28 NOTE — Progress Notes (Signed)
 " Virtual Visit Consent   Erica Buckley, you are scheduled for a virtual visit with a [facility] provider today. Just as with appointments in the office, your consent must be obtained to participate. Your consent will be active for this visit and any virtual visit you may have with one of our providers in the next 365 days. If you have a MyChart account, a copy of this consent can be sent to you electronically.  As this is a virtual visit, video technology does not allow for your provider to perform a traditional examination. This may limit your provider's ability to fully assess your condition. If your provider identifies any concerns that need to be evaluated in person or the need to arrange testing (such as labs, EKG, etc.), we will make arrangements to do so. Although advances in technology are sophisticated, we cannot ensure that it will always work on either your end or our end. If the connection with a video visit is poor, the visit may have to be switched to a telephone visit. With either a video or telephone visit, we are not always able to ensure that we have a secure connection.  By engaging in this virtual visit, you consent to the provision of healthcare and authorize for your insurance to be billed (if applicable) for the services provided during this visit. Depending on your insurance coverage, you may receive a charge related to this service.  I need to obtain your verbal consent now. Are you willing to proceed with your visit today? Erica Buckley has provided verbal consent on 02/28/2024 for a virtual visit (video or telephone). Lamar Schlossman, [name]  Date: 02/28/2024 12:24 PM   Virtual Visit via Video Note   I, Lamar Schlossman, connected with  Erica Buckley  ([id number], 1940-02-01) on 02/28/24 at 12:15 PM EST by a video-enabled telemedicine application and verified that I am speaking with the correct person using two identifiers.  Location: Patient:  Virtual Visit Location Patient: Home Provider: Virtual Visit Location Provider: Home Office   I discussed the limitations of evaluation and management by telemedicine and the availability of in person appointments. The patient expressed understanding and agreed to proceed.    History of Present Illness: Erica Buckley is a 84 y.o. who identifies as a female who was assigned female at birth, and is being seen today for flu like symptoms.  States that she has been sick for the past 5-6 days.  States that she has had generalize body aches.  Has had cough and productive cough.  She hasn't had a fever.  Reports associated sore throat.  States that now she is getting some sinus pressure.  HPI: HPI  Problems:  Patient Active Problem List   Diagnosis Date Noted   Type II or unspecified type diabetes mellitus without mention of complication, not stated as uncontrolled 09/02/2013   Osteoarthritis of left knee S/P Left total knee arthroplasty 08/29/2013   Hypertension 08/29/2013   Type II or unspecified type diabetes mellitus without mention of complication, uncontrolled 08/29/2013   Constipation 08/29/2013   Postoperative anemia due to acute blood loss 08/28/2013   Total knee replacement status 08/25/2013   Pain in limb- Right leg 08/01/2012   Aftercare following surgery of the circulatory system, NEC 03/07/2012   Varicose veins of lower extremities with ulcer and inflammation (HCC) 02/29/2012   Venous (peripheral) insufficiency 02/29/2012   Varicose veins of bilateral lower extremities with other complications 10/30/2011    Allergies: Allergies[1] Medications:  Current Medications[2]  Observations/Objective: Patient is well-developed, well-nourished in no acute distress.  Resting comfortably at home.  Head is normocephalic, atraumatic.  No labored breathing.  Speech is clear and coherent with logical content.  Patient is alert and oriented at baseline.    Assessment and  Plan: There are no diagnoses linked to this encounter.  Patient with cough, congestion for a week.  Now having productive cough, sinus pressure, and irritated eyes..  Will rx Augmentin  for sinusitis.  Discussed that if she isn't doing better in a few days, she should be seen in person.  Follow Up Instructions: I discussed the assessment and treatment plan with the patient. The patient was provided an opportunity to ask questions and all were answered. The patient agreed with the plan and demonstrated an understanding of the instructions.  A copy of instructions were sent to the patient via MyChart unless otherwise noted below.     The patient was advised to call back or seek an in-person evaluation if the symptoms worsen or if the condition fails to improve as anticipated.    Lamar Schlossman, [name]     [1]  Allergies Allergen Reactions   Codeine Shortness Of Breath    vomiting   Diovan [Valsartan] Cough    Increased stool   Other     No blood products, jehovah witness   Pioglitazone Other (See Comments)    Wgt gain, gas, bloating  [2]  Current Outpatient Medications:    amoxicillin -clavulanate (AUGMENTIN ) 875-125 MG tablet, Take 1 tablet by mouth every 12 (twelve) hours., Disp: 14 tablet, Rfl: 0   benzonatate  (TESSALON ) 100 MG capsule, Take 1 capsule (100 mg total) by mouth 2 (two) times daily as needed for cough., Disp: 20 capsule, Rfl: 0   B Complex-C (B-COMPLEX WITH VITAMIN C) tablet, Take 1 tablet by mouth every other day. , Disp: , Rfl:    diclofenac sodium (VOLTAREN) 1 % GEL, Apply 2 g topically 2 (two) times daily., Disp: , Rfl:    Empagliflozin-linaGLIPtin (GLYXAMBI) 10-5 MG TABS, Take by mouth., Disp: , Rfl:    ibuprofen  (ADVIL ,MOTRIN ) 800 MG tablet, Take 800 mg by mouth every 8 (eight) hours as needed for moderate pain., Disp: , Rfl:    losartan -hydrochlorothiazide  (HYZAAR) 50-12.5 MG per tablet, Take 1 tablet by mouth daily., Disp: , Rfl:    metoprolol  succinate  (TOPROL -XL) 25 MG 24 hr tablet, Take 25 mg by mouth daily., Disp: , Rfl:    TRADJENTA 5 MG TABS tablet, , Disp: , Rfl:   "
